# Patient Record
Sex: Female | Born: 1960 | Race: White | Hispanic: No | Smoking: Never smoker
Health system: Southern US, Community
[De-identification: ages and names within clinical notes are randomized; demographics above are authoritative.]

## PROBLEM LIST (undated history)

## (undated) DIAGNOSIS — T7840XA Allergy, unspecified, initial encounter: Secondary | ICD-10-CM

## (undated) DIAGNOSIS — F419 Anxiety disorder, unspecified: Secondary | ICD-10-CM

## (undated) DIAGNOSIS — K219 Gastro-esophageal reflux disease without esophagitis: Secondary | ICD-10-CM

## (undated) DIAGNOSIS — F32A Depression, unspecified: Secondary | ICD-10-CM

## (undated) HISTORY — DX: Depression, unspecified: F32.A

## (undated) HISTORY — DX: Anxiety disorder, unspecified: F41.9

## (undated) HISTORY — DX: Gastro-esophageal reflux disease without esophagitis: K21.9

## (undated) HISTORY — PX: BUNIONECTOMY: SHX129

## (undated) HISTORY — DX: Allergy, unspecified, initial encounter: T78.40XA

## (undated) HISTORY — PX: OTHER SURGICAL HISTORY: SHX169

---

## 2014-12-01 ENCOUNTER — Ambulatory Visit (INDEPENDENT_AMBULATORY_CARE_PROVIDER_SITE_OTHER): Payer: Self-pay | Admitting: Physician Assistant

## 2014-12-01 ENCOUNTER — Encounter: Payer: Self-pay | Admitting: Physician Assistant

## 2014-12-01 VITALS — BP 102/63 | HR 79 | Ht 68.0 in | Wt 143.0 lb

## 2014-12-01 DIAGNOSIS — G4700 Insomnia, unspecified: Secondary | ICD-10-CM | POA: Insufficient documentation

## 2014-12-01 DIAGNOSIS — F32A Depression, unspecified: Secondary | ICD-10-CM

## 2014-12-01 DIAGNOSIS — F329 Major depressive disorder, single episode, unspecified: Secondary | ICD-10-CM

## 2014-12-01 DIAGNOSIS — D259 Leiomyoma of uterus, unspecified: Secondary | ICD-10-CM

## 2014-12-01 DIAGNOSIS — F411 Generalized anxiety disorder: Secondary | ICD-10-CM

## 2014-12-01 DIAGNOSIS — B0050 Herpesviral ocular disease, unspecified: Secondary | ICD-10-CM

## 2014-12-01 DIAGNOSIS — F410 Panic disorder [episodic paroxysmal anxiety] without agoraphobia: Secondary | ICD-10-CM

## 2014-12-01 MED ORDER — CITALOPRAM HYDROBROMIDE 40 MG PO TABS: 40.0000 mg | ORAL_TABLET | Freq: Every day | ORAL | Status: DC

## 2014-12-01 MED ORDER — ZOLPIDEM TARTRATE 10 MG PO TABS: 10.0000 mg | ORAL_TABLET | Freq: Every evening | ORAL | Status: DC | PRN

## 2014-12-01 MED ORDER — ACYCLOVIR 400 MG PO TABS: 400.0000 mg | ORAL_TABLET | Freq: Two times a day (BID) | ORAL | Status: DC

## 2014-12-01 MED ORDER — LORAZEPAM 0.5 MG PO TABS: 0.5000 mg | ORAL_TABLET | Freq: Three times a day (TID) | ORAL | Status: DC | PRN

## 2014-12-01 NOTE — Progress Notes (Signed)
   Subjective:    Patient ID: Betty Stuart, female    DOB: Jan 15, 1961, 54 y.o.   MRN: [id number]  HPI Pt is a 54 yo female who presents to the clinic to establish care and get medication refills. She has no insurance for a while so only trying to do bare minimal.   .. Active Ambulatory Problems    Diagnosis Date Noted  . Panic attack 12/01/2014  . GAD (generalized anxiety disorder) 12/01/2014  . Depression 12/01/2014  . 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 Insomnia 12/01/2014  . Uterine fibroid 12/01/2014   Resolved Ambulatory Problems    Diagnosis Date Noted  . No Resolved Ambulatory Problems   No Additional Past Medical History   .Marland Kitchen Family History  Problem Relation Age of Onset  . Alcohol abuse Maternal Uncle   . Alcohol abuse Paternal Uncle    .Marland Kitchen History   Social History  . Marital Status: Married    Spouse Name: N/A  . Number of Children: N/A  . Years of Education: N/A   Occupational History  . Not on file.   Social History Main Topics  . Smoking status: Never Smoker   . Smokeless tobacco: Not on file  . Alcohol Use: 0.6 oz/week    1 Standard drinks or equivalent per week  . Drug Use: No  . Sexual Activity:    Partners: Male   Other Topics Concern  . Not on file   Social History Narrative  . No narrative on file      Review of Systems  All other systems reviewed and are negative.      Objective:   Physical Exam  Constitutional: She is oriented to person, place, and time. She appears well-developed and well-nourished.  HENT:  Head: Normocephalic and atraumatic.  Cardiovascular: Normal rate, regular rhythm and normal heart sounds.   Pulmonary/Chest: Effort normal and breath sounds normal.  Neurological: She is alert and oriented to person, place, and time.  Skin: Skin is dry.  Psychiatric: She has a normal mood and affect. Her behavior is normal.          Assessment & Plan:  GAD/Depression/panic attack- GAD-7 was 6. PHQ-9 was 6.  Controlled. Refilled ativan and celexa for 6 months.   Herpes of eye- refilled rx. Discussed any flares to see opthmaologist.   Insomnia- refilled ambien for 6 months.   Discussed needs CPE in future. No insurance at this time will hold off.  Last lipid per pt was Jan 2015 and good.

## 2015-06-01 ENCOUNTER — Encounter: Payer: Self-pay | Admitting: Physician Assistant

## 2015-06-01 ENCOUNTER — Ambulatory Visit (INDEPENDENT_AMBULATORY_CARE_PROVIDER_SITE_OTHER): Payer: Self-pay | Admitting: Physician Assistant

## 2015-06-01 VITALS — BP 106/69 | HR 75 | Ht 68.0 in | Wt 141.0 lb

## 2015-06-01 DIAGNOSIS — M5126 Other intervertebral disc displacement, lumbar region: Secondary | ICD-10-CM | POA: Insufficient documentation

## 2015-06-01 DIAGNOSIS — F32A Depression, unspecified: Secondary | ICD-10-CM

## 2015-06-01 DIAGNOSIS — F411 Generalized anxiety disorder: Secondary | ICD-10-CM

## 2015-06-01 DIAGNOSIS — G4700 Insomnia, unspecified: Secondary | ICD-10-CM

## 2015-06-01 DIAGNOSIS — F329 Major depressive disorder, single episode, unspecified: Secondary | ICD-10-CM

## 2015-06-01 DIAGNOSIS — F410 Panic disorder [episodic paroxysmal anxiety] without agoraphobia: Secondary | ICD-10-CM

## 2015-06-01 LAB — COMPLETE METABOLIC PANEL WITH GFR
ALT: 16 U/L (ref 6–29)
AST: 18 U/L (ref 10–35)
Albumin: 4.3 g/dL (ref 3.6–5.1)
Alkaline Phosphatase: 46 U/L (ref 33–130)
BILIRUBIN TOTAL: 0.3 mg/dL (ref 0.2–1.2)
BUN: 15 mg/dL (ref 7–25)
CHLORIDE: 104 mmol/L (ref 98–110)
CO2: 28 mmol/L (ref 20–31)
CREATININE: 0.85 mg/dL (ref 0.50–1.05)
Calcium: 9.3 mg/dL (ref 8.6–10.4)
GFR, Est Non African American: 79 mL/min (ref >=60)
GLUCOSE: 89 mg/dL (ref 65–99)
Potassium: 4.2 mmol/L (ref 3.5–5.3)
Sodium: 143 mmol/L (ref 135–146)
TOTAL PROTEIN: 6.3 g/dL (ref 6.1–8.1)

## 2015-06-01 MED ORDER — LORAZEPAM 0.5 MG PO TABS: 0.5000 mg | ORAL_TABLET | Freq: Three times a day (TID) | ORAL | Status: DC | PRN

## 2015-06-01 MED ORDER — ZOLPIDEM TARTRATE 10 MG PO TABS: 10.0000 mg | ORAL_TABLET | Freq: Every evening | ORAL | Status: DC | PRN

## 2015-06-01 MED ORDER — CITALOPRAM HYDROBROMIDE 40 MG PO TABS: 40.0000 mg | ORAL_TABLET | Freq: Every day | ORAL | Status: DC

## 2015-06-01 NOTE — Progress Notes (Signed)
   Subjective:    Patient ID: Betty Stuart, female    DOB: Jan 02, 1961, 54 y.o.   MRN: [id number]  HPI Pt presents to the clinic for medication refill.   Anxiety/depression/panic attacks- she feels fairly controlled today. Her dad is dying so her stress level has worsened. Taking celexa and ativan as needed. No side effects. No suicidal or homicidal thoughts.   Insomnia- well controlled with ambien as needed.   Does not have insurance.    Review of Systems  All other systems reviewed and are negative.      Objective:   Physical Exam  Constitutional: She is oriented to person, place, and time. She appears well-developed and well-nourished.  HENT:  Head: Normocephalic and atraumatic.  Neck: Normal range of motion. Neck supple. No thyromegaly present.  Cardiovascular: Normal rate, regular rhythm and normal heart sounds.   Pulmonary/Chest: Effort normal and breath sounds normal.  Neurological: She is alert and oriented to person, place, and time.  Skin: Skin is dry.  Psychiatric: She has a normal mood and affect. Her behavior is normal.          Assessment & Plan:  GAD/depression/panic attack- PHQ-9 was 6. GAD-7 was 5. Pt is going through a lot with Dad but overall feels controlled. Refilled celexa and ativan prn. cmp ordered for med management  Insomnia- refilled ambien for 6 months.   No insurance declined health maintanence due to cost.  Look for free mammogram in October for breast cancer awareness. Will do things as money presents.  Pt is aware of this risk.

## 2015-09-19 ENCOUNTER — Encounter: Payer: Self-pay | Admitting: Physician Assistant

## 2015-09-19 ENCOUNTER — Ambulatory Visit (INDEPENDENT_AMBULATORY_CARE_PROVIDER_SITE_OTHER): Payer: Self-pay | Admitting: Physician Assistant

## 2015-09-19 VITALS — BP 118/86 | HR 75 | Temp 98.4°F | Ht 68.0 in | Wt 141.0 lb

## 2015-09-19 DIAGNOSIS — J069 Acute upper respiratory infection, unspecified: Secondary | ICD-10-CM

## 2015-09-19 MED ORDER — HYDROCODONE-HOMATROPINE 5-1.5 MG/5ML PO SYRP: 5.0000 mL | ORAL_SOLUTION | Freq: Every evening | ORAL | Status: DC | PRN

## 2015-09-19 MED ORDER — AMOXICILLIN-POT CLAVULANATE 875-125 MG PO TABS: 1.0000 | ORAL_TABLET | Freq: Two times a day (BID) | ORAL | Status: DC

## 2015-09-19 NOTE — Patient Instructions (Signed)
Upper Respiratory Infection, Adult Most upper respiratory infections (URIs) are a viral infection of the air passages leading to the lungs. A URI affects the nose, throat, and upper air passages. The most common type of URI is nasopharyngitis and is typically referred to as "the common cold." URIs run their course and usually go away on their own. Most of the time, a URI does not require medical attention, but sometimes a bacterial infection in the upper airways can follow a viral infection. This is called a secondary infection. Sinus and middle ear infections are common types of secondary upper respiratory infections. Bacterial pneumonia can also complicate a URI. A URI can worsen asthma and chronic obstructive pulmonary disease (COPD). Sometimes, these complications can require emergency medical care and may be life threatening.  CAUSES Almost all URIs are caused by viruses. A virus is a type of germ and can spread from one person to another.  RISKS FACTORS You may be at risk for a URI if:   You smoke.   You have chronic heart or lung disease.  You have a weakened defense (immune) system.   You are very young or very old.   You have nasal allergies or asthma.  You work in crowded or poorly ventilated areas.  You work in health care facilities or schools. SIGNS AND SYMPTOMS  Symptoms typically develop 2-3 days after you come in contact with a cold virus. Most viral URIs last 7-10 days. However, viral URIs from the influenza virus (flu virus) can last 14-18 days and are typically more severe. Symptoms may include:   Runny or stuffy (congested) nose.   Sneezing.   Cough.   Sore throat.   Headache.   Fatigue.   Fever.   Loss of appetite.   Pain in your forehead, behind your eyes, and over your cheekbones (sinus pain).  Muscle aches.  DIAGNOSIS  Your health care provider may diagnose a URI by:  Physical exam.  Tests to check that your symptoms are not due to  another condition such as:  Strep throat.  Sinusitis.  Pneumonia.  Asthma. TREATMENT  A URI goes away on its own with time. It cannot be cured with medicines, but medicines may be prescribed or recommended to relieve symptoms. Medicines may help:  Reduce your fever.  Reduce your cough.  Relieve nasal congestion. HOME CARE INSTRUCTIONS   Take medicines only as directed by your health care provider.   Gargle warm saltwater or take cough drops to comfort your throat as directed by your health care provider.  Use a warm mist humidifier or inhale steam from a shower to increase air moisture. This may make it easier to breathe.  Drink enough fluid to keep your urine clear or pale yellow.   Eat soups and other clear broths and maintain good nutrition.   Rest as needed.   Return to work when your temperature has returned to normal or as your health care provider advises. You may need to stay home longer to avoid infecting others. You can also use a face mask and careful hand washing to prevent spread of the virus.  Increase the usage of your inhaler if you have asthma.   Do not use any tobacco products, including cigarettes, chewing tobacco, or electronic cigarettes. If you need help quitting, ask your health care provider. PREVENTION  The best way to protect yourself from getting a cold is to practice good hygiene.   Avoid oral or hand contact with people with cold   symptoms.   Wash your hands often if contact occurs.  There is no clear evidence that vitamin C, vitamin E, echinacea, or exercise reduces the chance of developing a cold. However, it is always recommended to get plenty of rest, exercise, and practice good nutrition.  SEEK MEDICAL CARE IF:   You are getting worse rather than better.   Your symptoms are not controlled by medicine.   You have chills.  You have worsening shortness of breath.  You have brown or red mucus.  You have yellow or brown nasal  discharge.  You have pain in your face, especially when you bend forward.  You have a fever.  You have swollen neck glands.  You have pain while swallowing.  You have white areas in the back of your throat. SEEK IMMEDIATE MEDICAL CARE IF:   You have severe or persistent:  Headache.  Ear pain.  Sinus pain.  Chest pain.  You have chronic lung disease and any of the following:  Wheezing.  Prolonged cough.  Coughing up blood.  A change in your usual mucus.  You have a stiff neck.  You have changes in your:  Vision.  Hearing.  Thinking.  Mood. MAKE SURE YOU:   Understand these instructions.  Will watch your condition.  Will get help right away if you are not doing well or get worse.   This information is not intended to replace advice given to you by your health care provider. Make sure you discuss any questions you have with your health care provider.   Document Released: 03/13/2001 Document Revised: 02/01/2015 Document Reviewed: 12/23/2013 Elsevier Interactive Patient Education 2016 [employer]  

## 2015-09-19 NOTE — Progress Notes (Signed)
   Subjective:    Patient ID: Markus Daft, female    DOB: 20-Dec-1960, 54 y.o.   MRN: [id number]  HPI   patient is a 54 year old female who presents to the clinic with sore throat, nasal congestion, sinus pressure  For 5 days. She has had a very mild cough with no production. She denies any fever, chills, body ache. She is taking Sudafed, Delsym, Tylenol, ibuprofen with little to no relief. She has not been able to work for the last 3 days.     Review of Systems  All other systems reviewed and are negative.      Objective:   Physical Exam  Constitutional: She is oriented to person, place, and time. She appears well-developed and well-nourished.  HENT:  Head: Normocephalic and atraumatic.  TM's amber color, dull light reflex, erythematous.  Tenderness over maxillary sinuses to palpation.  Bilateral nares red and swollen.  Oropharynx erythematous with PND>   Eyes: Conjunctivae are normal. Right eye exhibits no discharge. Left eye exhibits no discharge.  Neck: Normal range of motion. Neck supple.  Bilateral anterior cervical adenopathy non tender.   Cardiovascular: Normal rate, regular rhythm and normal heart sounds.   Pulmonary/Chest: Effort normal and breath sounds normal. She has no wheezes.  Neurological: She is alert and oriented to person, place, and time.  Skin: Skin is dry.  Psychiatric: She has a normal mood and affect. Her behavior is normal.          Assessment & Plan:   acute upper respiratory tract infection - certainly feel like this could be the start of bacterial infection from viral infection. Sinusitis seems to be developing. Treated with augmentin for 10 days. Hycodan given for cough. Pt has nausea associated with hydrocodone. Pt aware but would like to try if at half the dose. Encouraged pt to also use mucinex and flonase. Follow up as needed.

## 2015-11-29 ENCOUNTER — Encounter: Payer: Self-pay | Admitting: Physician Assistant

## 2015-11-29 ENCOUNTER — Ambulatory Visit (INDEPENDENT_AMBULATORY_CARE_PROVIDER_SITE_OTHER): Payer: Self-pay | Admitting: Physician Assistant

## 2015-11-29 VITALS — BP 106/60 | HR 80 | Ht 68.0 in | Wt 138.0 lb

## 2015-11-29 DIAGNOSIS — B0050 Herpesviral ocular disease, unspecified: Secondary | ICD-10-CM

## 2015-11-29 DIAGNOSIS — Z87410 Personal history of cervical dysplasia: Secondary | ICD-10-CM

## 2015-11-29 DIAGNOSIS — F329 Major depressive disorder, single episode, unspecified: Secondary | ICD-10-CM

## 2015-11-29 DIAGNOSIS — F411 Generalized anxiety disorder: Secondary | ICD-10-CM

## 2015-11-29 DIAGNOSIS — F32A Depression, unspecified: Secondary | ICD-10-CM

## 2015-11-29 DIAGNOSIS — G4700 Insomnia, unspecified: Secondary | ICD-10-CM

## 2015-11-29 MED ORDER — ACYCLOVIR 400 MG PO TABS: 400.0000 mg | ORAL_TABLET | Freq: Two times a day (BID) | ORAL | Status: DC

## 2015-11-29 MED ORDER — CITALOPRAM HYDROBROMIDE 40 MG PO TABS: 40.0000 mg | ORAL_TABLET | Freq: Every day | ORAL | Status: DC

## 2015-11-29 MED ORDER — ESZOPICLONE 1 MG PO TABS: ORAL_TABLET | ORAL | Status: DC

## 2015-11-29 NOTE — Progress Notes (Signed)
   Subjective:    Patient ID: Betty Stuart, female    DOB: 12-28-1960, 55 y.o.   MRN: [id number]  HPI  Pt is a 55 yo female who presents to the clinic for 6 month follow up.   She does want to switch ambien. She has been on for 15 plus years. She has always had sleep trouble per pt 90 percent of her life. She just feels like Lorrin Mais is not working as well anymore. She has never tried anything else.   GAD/Depression- doing well. No concerns of problems. Taking medication daily.  Herpes of eye- taking acyclovir daily for suppression. No problems.   She knows she needs pap. Hx of dysplasia and biopsy in 2014.    Review of Systems  All other systems reviewed and are negative.      Objective:   Physical Exam  Constitutional: She is oriented to person, place, and time. She appears well-developed and well-nourished.  HENT:  Head: Normocephalic and atraumatic.  Cardiovascular: Normal rate, regular rhythm and normal heart sounds.   Pulmonary/Chest: Effort normal and breath sounds normal. She has no wheezes.  Neurological: She is alert and oriented to person, place, and time.  Skin: Skin is dry.  Psychiatric: She has a normal mood and affect. Her behavior is normal.          Assessment & Plan:  Insomnia- stopped ambien. Start lunesta 1-3 tablets at bedtime. Discussed side effects. Call and let me know how doing. Will not make follow up due to being self pay.   Herpes of eye- refilled suppression therapy with acyclovir daily.   Depression/GAD- PHQ-9 was 8. GAD-7 was 6. Pt feels controlled. Refilled celexa for 6 months. She does not need ativan refill only used as needed.   History of cervical dysplasia- needs pap. Pt aware. She stated she will wait until 6 month follow up.   Discussed health prevention she has no insurance and cannot afford. Discussed looking for free screenings.

## 2016-01-10 ENCOUNTER — Telehealth: Payer: Self-pay | Admitting: *Deleted

## 2016-01-10 ENCOUNTER — Other Ambulatory Visit: Payer: Self-pay | Admitting: *Deleted

## 2016-01-10 MED ORDER — ZOLPIDEM TARTRATE 10 MG PO TABS: 10.0000 mg | ORAL_TABLET | Freq: Every evening | ORAL | Status: DC | PRN

## 2016-01-10 NOTE — Telephone Encounter (Signed)
Yes ok for Betty Stuart you can send over.

## 2016-01-10 NOTE — Telephone Encounter (Signed)
Pt left vm stating that the lunesta you switched her to doesn't seem to work as well and wants to know if she can go back to the 10mg  zolpidem. Please advise.

## 2016-01-11 NOTE — Telephone Encounter (Signed)
Pt notified of rx. 

## 2016-03-27 ENCOUNTER — Encounter: Payer: Self-pay | Admitting: Physician Assistant

## 2016-03-27 ENCOUNTER — Ambulatory Visit (INDEPENDENT_AMBULATORY_CARE_PROVIDER_SITE_OTHER): Payer: Self-pay | Admitting: Physician Assistant

## 2016-03-27 VITALS — BP 124/65 | HR 76 | Ht 68.0 in | Wt 141.0 lb

## 2016-03-27 DIAGNOSIS — F410 Panic disorder [episodic paroxysmal anxiety] without agoraphobia: Secondary | ICD-10-CM

## 2016-03-27 DIAGNOSIS — F329 Major depressive disorder, single episode, unspecified: Secondary | ICD-10-CM

## 2016-03-27 DIAGNOSIS — F411 Generalized anxiety disorder: Secondary | ICD-10-CM

## 2016-03-27 DIAGNOSIS — F32A Depression, unspecified: Secondary | ICD-10-CM

## 2016-03-27 DIAGNOSIS — G4700 Insomnia, unspecified: Secondary | ICD-10-CM

## 2016-03-27 MED ORDER — ZOLPIDEM TARTRATE 10 MG PO TABS: 10.0000 mg | ORAL_TABLET | Freq: Every evening | ORAL | Status: DC | PRN

## 2016-03-27 MED ORDER — ACYCLOVIR 400 MG PO TABS: 400.0000 mg | ORAL_TABLET | Freq: Two times a day (BID) | ORAL | Status: DC

## 2016-03-27 MED ORDER — CITALOPRAM HYDROBROMIDE 40 MG PO TABS: 40.0000 mg | ORAL_TABLET | Freq: Every day | ORAL | Status: DC

## 2016-03-27 MED ORDER — LORAZEPAM 0.5 MG PO TABS: 0.5000 mg | ORAL_TABLET | Freq: Three times a day (TID) | ORAL | Status: DC | PRN

## 2016-03-27 NOTE — Progress Notes (Signed)
   Subjective:    Patient ID: Betty Stuart, female    DOB: 1961-09-08, 55 y.o.   MRN: [id number]  HPI Pt is a 55 yo female who presents to the clinic for medication refill.   Insomnia- controlled with ambien. No problems or concerns.   Herpes simplex of eye- on suppression therapy with antiviral.   GAD/panic attacks- she is quiting her job on Friday which should help with anxiety and panic attacks. She is taking ativan twice a day. She takes celexa daily. No suicidal or homicidal thoughts.   Pt will be out of insurance and a job on Friday. Needs 6 month refills.   Review of Systems  All other systems reviewed and are negative.      Objective:   Physical Exam  Constitutional: She is oriented to person, place, and time. She appears well-developed and well-nourished.  HENT:  Head: Normocephalic and atraumatic.  Cardiovascular: Normal rate, regular rhythm and normal heart sounds.   Pulmonary/Chest: Effort normal and breath sounds normal.  Neurological: She is alert and oriented to person, place, and time.  Skin: Skin is dry.  Psychiatric: She has a normal mood and affect. Her behavior is normal.          Assessment & Plan:  Insomnia- ambien refilled for 6 months.   GAD/panic attacks/depression- PHQ-9 was 4. GAD-7 was 4. Refill celexa and ativan. For 6 months.   Herpes simplex of eyes- acyclovir refilled for 6 months.   Discussion of need for CPE, colonoscopy, pap smear, mammogram. Pt states once she gets a job and insurance again she will schedule.

## 2016-05-28 ENCOUNTER — Ambulatory Visit: Payer: Self-pay | Admitting: Physician Assistant

## 2016-10-09 ENCOUNTER — Ambulatory Visit (INDEPENDENT_AMBULATORY_CARE_PROVIDER_SITE_OTHER): Payer: Self-pay | Admitting: Physician Assistant

## 2016-10-09 ENCOUNTER — Encounter: Payer: Self-pay | Admitting: Physician Assistant

## 2016-10-09 VITALS — BP 114/66 | HR 84 | Ht 68.0 in | Wt 142.0 lb

## 2016-10-09 DIAGNOSIS — F411 Generalized anxiety disorder: Secondary | ICD-10-CM

## 2016-10-09 DIAGNOSIS — B001 Herpesviral vesicular dermatitis: Secondary | ICD-10-CM | POA: Insufficient documentation

## 2016-10-09 DIAGNOSIS — F5104 Psychophysiologic insomnia: Secondary | ICD-10-CM

## 2016-10-09 DIAGNOSIS — F3340 Major depressive disorder, recurrent, in remission, unspecified: Secondary | ICD-10-CM

## 2016-10-09 MED ORDER — ESZOPICLONE 3 MG PO TABS: 3.0000 mg | ORAL_TABLET | Freq: Every day | ORAL | 5 refills | Status: DC

## 2016-10-09 MED ORDER — LORAZEPAM 0.5 MG PO TABS: 0.5000 mg | ORAL_TABLET | Freq: Three times a day (TID) | ORAL | 1 refills | Status: DC | PRN

## 2016-10-09 MED ORDER — CITALOPRAM HYDROBROMIDE 40 MG PO TABS: 40.0000 mg | ORAL_TABLET | Freq: Every day | ORAL | 1 refills | Status: DC

## 2016-10-09 MED ORDER — ACYCLOVIR 400 MG PO TABS: 400.0000 mg | ORAL_TABLET | Freq: Two times a day (BID) | ORAL | 1 refills | Status: DC

## 2016-10-09 NOTE — Progress Notes (Signed)
   Subjective:    Patient ID: Betty Stuart, female    DOB: 1961/08/22, 56 y.o.   MRN: [id number]  HPI  Pt is a 56 yo female who presents to the clinic for medication refill.   Depression/GAD- doing great. Much improved. [employer] job. Very happy. Feels like she has more energy. Denies any suicidal or homicidal thoughts.   Insomnia- taking ambien and ativan together to go to sleep. Remembers lunesta worked better but could not afford. She now has an appt that makes lunesta affordable.   No insurance declined health maintaince.   Pt had screening labs done at work and brings in copy to scan into system.   Review of Systems  All other systems reviewed and are negative.      Objective:   Physical Exam  Constitutional: She is oriented to person, place, and time. She appears well-developed and well-nourished.  HENT:  Head: Normocephalic and atraumatic.  Cardiovascular: Normal rate, regular rhythm and normal heart sounds.   Pulmonary/Chest: Effort normal and breath sounds normal.  Neurological: She is alert and oriented to person, place, and time.  Psychiatric: She has a normal mood and affect. Her behavior is normal.          Assessment & Plan:  Marland KitchenMarland KitchenDiagnoses and all orders for this visit:  Recurrent major depressive disorder, in remission ([place]) -     citalopram (CELEXA) 40 MG tablet; Take 1 tablet (40 mg total) by mouth daily.  GAD (generalized anxiety disorder) -     citalopram (CELEXA) 40 MG tablet; Take 1 tablet (40 mg total) by mouth daily. -     LORazepam (ATIVAN) 0.5 MG tablet; Take 1 tablet (0.5 mg total) by mouth every 8 (eight) hours as needed for anxiety.  Psychophysiological insomnia -     Eszopiclone 3 MG TABS; Take 1 tablet (3 mg total) by mouth at bedtime. Take immediately before bedtime  Recurrent cold sores -     acyclovir (ZOVIRAX) 400 MG tablet; Take 1 tablet (400 mg total) by mouth 2 (two) times daily.    PHQ-9 was 1. GAD-7 was 1.   Pt is doing great.    LDL is 99, TG is 57, glucose 86, WML complete CMP.   Pt will schedule cPE for march when she gets insurance and talk about other health prevention needs.

## 2016-12-07 ENCOUNTER — Encounter: Payer: Self-pay | Admitting: Physician Assistant

## 2016-12-07 ENCOUNTER — Other Ambulatory Visit (HOSPITAL_COMMUNITY)
Admission: RE | Admit: 2016-12-07 | Discharge: 2016-12-07 | Disposition: A | Discharge status: Home / Self Care | Payer: 59 | Source: Ambulatory Visit | Attending: Physician Assistant | Admitting: Physician Assistant

## 2016-12-07 ENCOUNTER — Ambulatory Visit (INDEPENDENT_AMBULATORY_CARE_PROVIDER_SITE_OTHER): Payer: 59 | Admitting: Physician Assistant

## 2016-12-07 VITALS — BP 99/60 | HR 80 | Ht 68.0 in | Wt 141.0 lb

## 2016-12-07 DIAGNOSIS — Z1151 Encounter for screening for human papillomavirus (HPV): Secondary | ICD-10-CM | POA: Diagnosis not present

## 2016-12-07 DIAGNOSIS — Z01419 Encounter for gynecological examination (general) (routine) without abnormal findings: Secondary | ICD-10-CM | POA: Insufficient documentation

## 2016-12-07 DIAGNOSIS — B351 Tinea unguium: Secondary | ICD-10-CM | POA: Diagnosis not present

## 2016-12-07 MED ORDER — TERBINAFINE HCL 250 MG PO TABS: 250.0000 mg | ORAL_TABLET | Freq: Every day | ORAL | 0 refills | Status: AC

## 2016-12-07 NOTE — Progress Notes (Signed)
Subjective:    Patient ID: Betty Stuart, female    DOB: 08-Jan-1961, 56 y.o.   MRN: [id number]  HPI Pt is a 56 yo female who presents to the clinic for pap smear. She has never had abnormal pap. She is currently not sexually active. She has no concerns or complaints.   .. Active Ambulatory Problems    Diagnosis Date Noted  . Panic attack 12/01/2014  . GAD (generalized anxiety disorder) 12/01/2014  . Depression 12/01/2014  . 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 Insomnia 12/01/2014  . Uterine fibroid 12/01/2014  . Lumbar herniated disc 06/01/2015  . History of cervical dysplasia 11/29/2015  . 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   Resolved Ambulatory Problems    Diagnosis Date Noted  . No Resolved Ambulatory Problems   No Additional Past Medical History   .Marland Kitchen Family History  Problem Relation Age of Onset  . Alcohol abuse Maternal Uncle   . Alcohol abuse Paternal Uncle    .Marland Kitchen Social History   Social History  . Marital status: Married    Spouse name: N/A  . Number of children: N/A  . Years of education: N/A   Occupational History  . Not on file.   Social History Main Topics  . Smoking status: Never Smoker  . Smokeless tobacco: Never Used  . Alcohol use 0.6 oz/week    1 Standard drinks or equivalent per week  . Drug use: No  . Sexual activity: Not Currently    Partners: Male   Other Topics Concern  . Not on file   Social History Narrative  . No narrative on file      Review of Systems  All other systems reviewed and are negative.      Objective:   Physical Exam  Constitutional: She is oriented to person, place, and time. She appears well-developed and well-nourished.  HENT:  Head: Normocephalic and atraumatic.  Right Ear: External ear normal.  Left Ear: External ear normal.  Nose: Nose normal.  Mouth/Throat: Oropharynx is clear and moist. No oropharyngeal exudate.  Eyes: Conjunctivae and EOM are normal. Pupils are equal, round, and reactive to light.  Right eye exhibits no discharge. Left eye exhibits no discharge.  Neck: Normal range of motion. Neck supple.  Cardiovascular: Normal rate, regular rhythm and normal heart sounds.   Pulmonary/Chest: Effort normal and breath sounds normal. She has no wheezes.  No breast masses or tenderness.   Abdominal: Soft. Bowel sounds are normal. She exhibits no distension and no mass. There is no tenderness. There is no rebound and no guarding.  Genitourinary: Vagina normal and uterus normal. No vaginal discharge found.  Genitourinary Comments: No adenxal masses or tenderness.  No cervical polyps or erythema.   Lymphadenopathy:    She has no cervical adenopathy.  Neurological: She is alert and oriented to person, place, and time.  Skin:  All toenails thick and yellow.   Psychiatric: She has a normal mood and affect. Her behavior is normal.          Assessment & Plan:  Marland KitchenMarland KitchenDawn was seen today for annual exam and gynecologic exam.  Diagnoses and all orders for this visit:  Encounter for routine gynecological examination with Papanicolaou smear of cervix -     Cytology - PAP  Onychomycosis -     terbinafine (LAMISIL) 250 MG tablet; Take 1 tablet (250 mg total) by mouth daily. Take for 12 weeks.   Pt declined STD testing.  Will get mammogram in summer per  patient. Pt declines colonoscopy until later.  Start lamisil for fungus. Recheck liver enzymes in 6 weeks.

## 2016-12-07 NOTE — Patient Instructions (Addendum)
Keeping You Healthy  Get These Tests  Blood Pressure- Have your blood pressure checked by your healthcare provider at least once a year.  Normal blood pressure is 120/80.  Weight- Have your body mass index (BMI) calculated to screen for obesity.  BMI is a measure of body fat based on height and weight.  You can calculate your own BMI at www.nhlbisupport.com/bmi/  Cholesterol- Have your cholesterol checked every year.  Diabetes- Have your blood sugar checked every year if you have high blood pressure, high cholesterol, a family history of diabetes or if you are overweight.  Pap Test - Have a pap test every 1 to 5 years if you have been sexually active.  If you are older than 54 and recent pap tests have been normal you may not need additional pap tests.  In addition, if you have had a hysterectomy  for benign disease additional pap tests are not necessary.  Mammogram-Yearly mammograms are essential for early detection of breast cancer  Screening for Colon Cancer- Colonoscopy starting at age 50. Screening may begin sooner depending on your family history and other health conditions.  Follow up colonoscopy as directed by your Gastroenterologist.  Screening for Osteoporosis- Screening begins at age 54 with bone density scanning, sooner if you are at higher risk for developing Osteoporosis.  Get these medicines  Calcium with Vitamin D- Your body requires 1200-1500 mg of Calcium a day and 800-1000 IU of Vitamin D a day.  You can only absorb 500 mg of Calcium at a time therefore Calcium must be taken in 2 or 3 separate doses throughout the day.  Hormones- Hormone therapy has been associated with increased risk for certain cancers and heart disease.  Talk to your healthcare provider about if you need relief from menopausal symptoms.  Aspirin- Ask your healthcare provider about taking Aspirin to prevent Heart Disease and Stroke.  Get these Immuniztions  Flu shot- Every fall  Pneumonia shot-  Once after the age of 54; if you are younger ask your healthcare provider if you need a pneumonia shot.  Tetanus- Every ten years.  Zostavax- Once after the age of 54 to prevent shingles.  Take these steps  Don't smoke- Your healthcare provider can help you quit. For tips on how to quit, ask your healthcare provider or go to www.smokefree.gov or call 1-800 QUIT-NOW.  Be physically active- Exercise 5 days a week for a minimum of 30 minutes.  If you are not already physically active, start slow and gradually work up to 30 minutes of moderate physical activity.  Try walking, dancing, bike riding, swimming, etc.  Eat a healthy diet- Eat a variety of healthy foods such as fruits, vegetables, whole grains, low fat milk, low fat cheeses, yogurt, lean meats, chicken, fish, eggs, dried beans, tofu, etc.  For more information go to www.thenutritionsource.org  Dental visit- Brush and floss teeth twice daily; visit your dentist twice a year.  Eye exam- Visit your Optometrist or Ophthalmologist yearly.  Drink alcohol in moderation- Limit alcohol intake to one drink or less a day.  Never drink and drive.  Depression- Your emotional health is as important as your physical health.  If you're feeling down or losing interest in things you normally enjoy, please talk to your healthcare provider.  Seat Belts- can save your life; always wear one  Smoke/Carbon Monoxide detectors- These detectors need to be installed on the appropriate level of your home.  Replace batteries at least once a year.  Violence- If   anyone is threatening or hurting you, please tell your healthcare provider.  Living Will/ Health care power of attorney- Discuss with your healthcare provider and family.  Preventing Toenail Fungus from Recurring   Sanitize your shoes with Mycomist spray or a similar shoe sanitizer spray.  Follow the instructions on the bottle and dry them outside in the sun or with a hairdryer.  We also recommend  repeating the sanitization once weekly in shoes you wear most often.   Throw away any shoes you have worn a significant amount without socks-fungus thrives in a warm moist environment and you want to avoid re-infection after your laser procedure   Bleach your socks with regular or color safe bleach   Change your socks regularly to keep your feet clean and dry (especially if you have sweaty feet)-if sweaty feet are a problem, let your doctor know-there is a great lotion that helps with this problem.   Clean your toenail clippers with alcohol before you use them if you do your own toenails and make sure to replace [place] boards and orange sticks regularly   If you get regular pedicures, bring your own instruments or go to a spa that sterilizes their instruments in an autoclave.

## 2016-12-13 LAB — CYTOLOGY - PAP
DIAGNOSIS: NEGATIVE
HPV (WINDOPATH): NOT DETECTED

## 2016-12-18 ENCOUNTER — Encounter: Payer: Self-pay | Admitting: Physician Assistant

## 2017-03-13 ENCOUNTER — Encounter: Payer: Self-pay | Admitting: Physician Assistant

## 2017-03-13 ENCOUNTER — Ambulatory Visit (INDEPENDENT_AMBULATORY_CARE_PROVIDER_SITE_OTHER): Payer: 59 | Admitting: Physician Assistant

## 2017-03-13 VITALS — BP 103/64 | HR 87 | Ht 68.0 in | Wt 144.0 lb

## 2017-03-13 DIAGNOSIS — F411 Generalized anxiety disorder: Secondary | ICD-10-CM | POA: Diagnosis not present

## 2017-03-13 DIAGNOSIS — F5101 Primary insomnia: Secondary | ICD-10-CM

## 2017-03-13 DIAGNOSIS — F3340 Major depressive disorder, recurrent, in remission, unspecified: Secondary | ICD-10-CM | POA: Diagnosis not present

## 2017-03-13 DIAGNOSIS — B001 Herpesviral vesicular dermatitis: Secondary | ICD-10-CM

## 2017-03-13 DIAGNOSIS — B351 Tinea unguium: Secondary | ICD-10-CM

## 2017-03-13 DIAGNOSIS — Z1231 Encounter for screening mammogram for malignant neoplasm of breast: Secondary | ICD-10-CM

## 2017-03-13 DIAGNOSIS — B0050 Herpesviral ocular disease, unspecified: Secondary | ICD-10-CM

## 2017-03-13 MED ORDER — ACYCLOVIR 400 MG PO TABS: 400.0000 mg | ORAL_TABLET | Freq: Two times a day (BID) | ORAL | 3 refills | Status: DC

## 2017-03-13 MED ORDER — CITALOPRAM HYDROBROMIDE 40 MG PO TABS: 40.0000 mg | ORAL_TABLET | Freq: Every day | ORAL | 3 refills | Status: DC

## 2017-03-13 MED ORDER — CICLOPIROX 8 % EX SOLN: Freq: Every day | CUTANEOUS | 0 refills | Status: DC

## 2017-03-13 MED ORDER — ESZOPICLONE 3 MG PO TABS: 3.0000 mg | ORAL_TABLET | Freq: Every day | ORAL | 5 refills | Status: DC

## 2017-03-13 NOTE — Progress Notes (Signed)
Subjective:    Patient ID: Betty Stuart, female    DOB: 09-13-1961, 56 y.o.   MRN: [id number]  HPI Pt is a 56 yo female who presents to the clinic for 6 month follow up.   She is doing great with mood and anxiety. She rarely uses ativan and does not need refill. celexa is doing great. No side effects.   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 Doing great.   Insomnia- doing well with lunesta and melatonin. No concerns.   Toenail fungus- just completed lamisil for 3 months. Nails appear better. Wants to know if there is anything else she can do.    .. Active Ambulatory Problems    Diagnosis Date Noted  . Panic attack 12/01/2014  . GAD (generalized anxiety disorder) 12/01/2014  . Depression 12/01/2014  . 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 Insomnia 12/01/2014  . Uterine fibroid 12/01/2014  . Lumbar herniated disc 06/01/2015  . History of cervical dysplasia 11/29/2015  . 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 Toenail fungus 03/13/2017   Resolved Ambulatory Problems    Diagnosis Date Noted  . No Resolved Ambulatory Problems   No Additional Past Medical History      Review of Systems  All other systems reviewed and are negative.      Objective:   Physical Exam  Constitutional: She is oriented to person, place, and time. She appears well-developed and well-nourished.  HENT:  Head: Normocephalic and atraumatic.  Cardiovascular: Normal rate, regular rhythm and normal heart sounds.   Pulmonary/Chest: Effort normal and breath sounds normal.  Neurological: She is alert and oriented to person, place, and time.  Skin:  Great toenail bilateral discoloration and thickness with healthy nail growing from bed.   Psychiatric: She has a normal mood and affect. Her behavior is normal.          Assessment & Plan:   Marland KitchenMarland KitchenDiagnoses and all orders for this visit:  Recurrent major depressive disorder, in remission ([place]) -     citalopram (CELEXA)  40 MG tablet; Take 1 tablet (40 mg total) by mouth daily.  GAD (generalized anxiety disorder) -     citalopram (CELEXA) 40 MG tablet; Take 1 tablet (40 mg total) by mouth daily.  Primary insomnia -     Eszopiclone 3 MG TABS; Take 1 tablet (3 mg total) by mouth at bedtime. Take immediately before bedtime  Recurrent cold sores -     acyclovir (ZOVIRAX) 400 MG tablet; Take 1 tablet (400 mg total) by mouth 2 (two) times daily.  Herpes simplex of eye  Visit for screening mammogram -     MM SCREENING BREAST TOMO BILATERAL; Future -     MM SCREENING BREAST TOMO BILATERAL  Toenail fungus -     ciclopirox (PENLAC) 8 % solution; Apply topically at bedtime. Apply over nail and surrounding skin. Apply daily over previous coat. After seven (7) days, may remove with alcohol and continue cycle.  .. Depression screen [facility] 2/9 03/13/2017 03/13/2017 12/07/2016 10/09/2016 03/27/2016  Decreased Interest 0 0 0 0 1  Down, Depressed, Hopeless 1 0 1 0 1  PHQ - 2 Score 1 0 1 0 2  Altered sleeping 0 - - 1 0  Tired, decreased energy 0 - - 0 0  Change in appetite 0 - - 0 0  Feeling bad or failure about yourself  0 - - 0 0  Trouble concentrating - - - 0 0  Moving slowly or fidgety/restless  0 - - 0 0  Suicidal thoughts 0 - - 0 0  PHQ-9 Score 1 - - 1 2  Difficult doing work/chores - - - - Not difficult at all    .Marland Kitchen GAD 7 : Generalized Anxiety Score 03/13/2017 10/09/2016 03/27/2016 11/29/2015  Nervous, Anxious, on Edge 0 0 1 1  Control/stop worrying 0 0 1 2  Worry too much - different things 1 1 1 1   Trouble relaxing 0 0 0 1  Restless 0 0 0 -  Easily annoyed or irritable 1 0 1 1  Afraid - awful might happen 0 0 0 -  Total GAD 7 Score 2 1 4  -  Anxiety Difficulty Not difficult at all Not difficult at all - Not difficult at all    celexa refilled.   She just finished lamisil. Reassured patient nails appear to be growing good out of the nail bed. penlac given to continue treatment.   Info given on shingrix. Pt  will consider.  Declined colonoscopy.  Ordered mammogram.  Declined Tdap.

## 2017-03-27 ENCOUNTER — Ambulatory Visit: Payer: 59 | Admitting: Physician Assistant

## 2017-03-29 ENCOUNTER — Ambulatory Visit: Payer: 59 | Admitting: Physician Assistant

## 2017-05-07 ENCOUNTER — Ambulatory Visit: Payer: 59

## 2017-05-15 ENCOUNTER — Ambulatory Visit (INDEPENDENT_AMBULATORY_CARE_PROVIDER_SITE_OTHER): Payer: 59

## 2017-05-15 DIAGNOSIS — Z1231 Encounter for screening mammogram for malignant neoplasm of breast: Secondary | ICD-10-CM | POA: Diagnosis not present

## 2017-05-15 NOTE — Progress Notes (Signed)
Call pt: normal mammogram. Follow up in 1 year.

## 2017-06-19 ENCOUNTER — Encounter: Payer: Self-pay | Admitting: Physician Assistant

## 2017-06-19 ENCOUNTER — Ambulatory Visit (INDEPENDENT_AMBULATORY_CARE_PROVIDER_SITE_OTHER): Payer: 59 | Admitting: Physician Assistant

## 2017-06-19 VITALS — BP 107/61 | HR 68 | Wt 144.0 lb

## 2017-06-19 DIAGNOSIS — N760 Acute vaginitis: Secondary | ICD-10-CM | POA: Diagnosis not present

## 2017-06-19 LAB — LIPID PANEL
CHOLESTEROL: 134 (ref 0–200)
HDL: 76 — AB (ref 35–70)
LDL CALC: 43
LDL/HDL RATIO: 1.8
TRIGLYCERIDES: 76 (ref 40–160)

## 2017-06-19 LAB — BASIC METABOLIC PANEL: Glucose: 88

## 2017-06-19 LAB — WET PREP FOR TRICH, YEAST, CLUE

## 2017-06-19 LAB — HEMOGLOBIN A1C: Hemoglobin A1C: 4.8

## 2017-06-19 MED ORDER — FLUCONAZOLE 150 MG PO TABS: 150.0000 mg | ORAL_TABLET | Freq: Once | ORAL | 0 refills | Status: AC

## 2017-06-19 NOTE — Patient Instructions (Signed)

## 2017-06-19 NOTE — Progress Notes (Addendum)
   Subjective:    Patient ID: Betty Stuart, female    DOB: Nov 19, 1960, 56 y.o.   MRN: [id number]  HPI The patient is a 56 yo female who presents to the clinic today with a chief complaint of vaginal itching and discharge off and on for 1 month. She reports a history of frequent vaginal yeast infections. She tried OTC Monistat but, the symptoms did not fully resolve. Denies pain with urination, vaginal pain, blood, fever, or chills. Patient reports that she is postmenopausal. Patient also has a history of uterine fibroids.    .. Active Ambulatory Problems    Diagnosis Date Noted  . Panic attack 12/01/2014  . GAD (generalized anxiety disorder) 12/01/2014  . Depression 12/01/2014  . 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 Insomnia 12/01/2014  . Uterine fibroid 12/01/2014  . Lumbar herniated disc 06/01/2015  . History of cervical dysplasia 11/29/2015  . 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 Toenail fungus 03/13/2017   Resolved Ambulatory Problems    Diagnosis Date Noted  . No Resolved Ambulatory Problems   No Additional Past Medical History       Review of Systems  All other systems reviewed and are negative.      Objective:   Physical Exam  Constitutional: She appears well-developed and well-nourished.  HENT:  Head: Normocephalic and atraumatic.  Cardiovascular: Normal rate, regular rhythm and normal heart sounds.   Abdominal: Soft. Bowel sounds are normal. She exhibits no distension and no mass. There is no tenderness. There is no rebound and no guarding.  Psychiatric: She has a normal mood and affect. Her behavior is normal.          Assessment & Plan:  .Marland KitchenArrie Stuart was seen today for vaginitis.  Diagnoses and all orders for this visit:  Acute vaginitis -     WET PREP FOR TRICH, YEAST, CLUE -     fluconazole (DIFLUCAN) 150 MG tablet; Take 1 tablet (150 mg total) by mouth once. Repeat in 48-72 hours.   Will call with results.  diflucan given to use empirically to treat  yeast infection. Follow up if symptoms persists.  HO given.   .. Results for orders placed or performed in visit on 06/19/17  WET PREP FOR [place], YEAST, CLUE  Result Value Ref Range   Source: VAGINA    RESULT     Positive for clue cells. Sent metronidazole. Discussed not need for diflucan. Follow up with any discharge after this clears.

## 2017-06-20 ENCOUNTER — Telehealth: Payer: Self-pay | Admitting: *Deleted

## 2017-06-20 MED ORDER — METRONIDAZOLE 500 MG PO TABS: 500.0000 mg | ORAL_TABLET | Freq: Two times a day (BID) | ORAL | 0 refills | Status: AC

## 2017-06-20 NOTE — Telephone Encounter (Signed)
So phone note

## 2017-07-17 ENCOUNTER — Encounter: Payer: Self-pay | Admitting: Physician Assistant

## 2017-09-13 ENCOUNTER — Encounter: Payer: Self-pay | Admitting: Physician Assistant

## 2017-09-13 ENCOUNTER — Ambulatory Visit (INDEPENDENT_AMBULATORY_CARE_PROVIDER_SITE_OTHER): Payer: 59 | Admitting: Physician Assistant

## 2017-09-13 DIAGNOSIS — F5101 Primary insomnia: Secondary | ICD-10-CM | POA: Diagnosis not present

## 2017-09-13 DIAGNOSIS — B001 Herpesviral vesicular dermatitis: Secondary | ICD-10-CM | POA: Diagnosis not present

## 2017-09-13 DIAGNOSIS — F3340 Major depressive disorder, recurrent, in remission, unspecified: Secondary | ICD-10-CM | POA: Diagnosis not present

## 2017-09-13 DIAGNOSIS — F411 Generalized anxiety disorder: Secondary | ICD-10-CM | POA: Diagnosis not present

## 2017-09-13 MED ORDER — CITALOPRAM HYDROBROMIDE 40 MG PO TABS: 40.0000 mg | ORAL_TABLET | Freq: Every day | ORAL | 3 refills | Status: DC

## 2017-09-13 MED ORDER — ESZOPICLONE 3 MG PO TABS: 3.0000 mg | ORAL_TABLET | Freq: Every day | ORAL | 5 refills | Status: DC

## 2017-09-13 MED ORDER — ACYCLOVIR 400 MG PO TABS: 400.0000 mg | ORAL_TABLET | Freq: Two times a day (BID) | ORAL | 3 refills | Status: DC

## 2017-09-15 NOTE — Progress Notes (Signed)
   Subjective:    Patient ID: Betty Stuart, female    DOB: 03-06-1961, 56 y.o.   MRN: [id number]  HPI Pt is a 56 yo female who presents to the clinic for 6 month follow up.   Depression/anxiety- doing well no concerns or complaints. Denies any suicidal or homicidal thoughts. She rarely uses ativan. She does not need refill. She has many left.   Insomnia- sleeping well with lunesta. No side effects.   Recurrent cold sores- she takes daily suppressive therapy. She has had no outbreaks.   .. Active Ambulatory Problems    Diagnosis Date Noted  . Panic attack 12/01/2014  . GAD (generalized anxiety disorder) 12/01/2014  . Depression 12/01/2014  . 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 Insomnia 12/01/2014  . Uterine fibroid 12/01/2014  . Lumbar herniated disc 06/01/2015  . History of cervical dysplasia 11/29/2015  . 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 Toenail fungus 03/13/2017   Resolved Ambulatory Problems    Diagnosis Date Noted  . No Resolved Ambulatory Problems   No Additional Past Medical History      Review of Systems  All other systems reviewed and are negative.      Objective:   Physical Exam  Constitutional: She is oriented to person, place, and time. She appears well-developed and well-nourished.  HENT:  Head: Normocephalic and atraumatic.  Cardiovascular: Normal rate, regular rhythm and normal heart sounds.  Pulmonary/Chest: Effort normal and breath sounds normal.  Neurological: She is alert and oriented to person, place, and time.  Psychiatric: She has a normal mood and affect. Her behavior is normal.          Assessment & Plan:  Marland KitchenMarland KitchenDawn was seen today for depression and insomnia.  Diagnoses and all orders for this visit:  Recurrent major depressive disorder, in remission ([place]) -     citalopram (CELEXA) 40 MG tablet; Take 1 tablet (40 mg total) by mouth daily.  GAD (generalized anxiety disorder) -     citalopram (CELEXA) 40 MG tablet; Take 1 tablet  (40 mg total) by mouth daily.  Primary insomnia -     Eszopiclone 3 MG TABS; Take 1 tablet (3 mg total) by mouth at bedtime. Take immediately before bedtime  Recurrent cold sores -     acyclovir (ZOVIRAX) 400 MG tablet; Take 1 tablet (400 mg total) by mouth 2 (two) times daily.   Follow up in 6 months.

## 2018-03-10 ENCOUNTER — Ambulatory Visit (INDEPENDENT_AMBULATORY_CARE_PROVIDER_SITE_OTHER): Payer: 59 | Admitting: Physician Assistant

## 2018-03-10 ENCOUNTER — Encounter: Payer: Self-pay | Admitting: Physician Assistant

## 2018-03-10 VITALS — BP 102/58 | HR 81 | Ht 68.0 in | Wt 145.0 lb

## 2018-03-10 DIAGNOSIS — F3340 Major depressive disorder, recurrent, in remission, unspecified: Secondary | ICD-10-CM

## 2018-03-10 DIAGNOSIS — F410 Panic disorder [episodic paroxysmal anxiety] without agoraphobia: Secondary | ICD-10-CM | POA: Diagnosis not present

## 2018-03-10 DIAGNOSIS — B0050 Herpesviral ocular disease, unspecified: Secondary | ICD-10-CM | POA: Diagnosis not present

## 2018-03-10 DIAGNOSIS — N898 Other specified noninflammatory disorders of vagina: Secondary | ICD-10-CM | POA: Diagnosis not present

## 2018-03-10 DIAGNOSIS — B001 Herpesviral vesicular dermatitis: Secondary | ICD-10-CM | POA: Diagnosis not present

## 2018-03-10 DIAGNOSIS — F5101 Primary insomnia: Secondary | ICD-10-CM

## 2018-03-10 DIAGNOSIS — F411 Generalized anxiety disorder: Secondary | ICD-10-CM | POA: Diagnosis not present

## 2018-03-10 MED ORDER — ESZOPICLONE 3 MG PO TABS: 3.0000 mg | ORAL_TABLET | Freq: Every day | ORAL | 5 refills | Status: DC

## 2018-03-10 MED ORDER — ACYCLOVIR 400 MG PO TABS: 400.0000 mg | ORAL_TABLET | Freq: Two times a day (BID) | ORAL | 3 refills | Status: DC

## 2018-03-10 MED ORDER — LORAZEPAM 0.5 MG PO TABS: 0.5000 mg | ORAL_TABLET | Freq: Three times a day (TID) | ORAL | 1 refills | Status: DC | PRN

## 2018-03-10 MED ORDER — CITALOPRAM HYDROBROMIDE 40 MG PO TABS: 40.0000 mg | ORAL_TABLET | Freq: Every day | ORAL | 3 refills | Status: DC

## 2018-03-10 NOTE — Progress Notes (Signed)
Subjective:    Patient ID: Betty Stuart, female    DOB: August 26, 1961, 57 y.o.   MRN: [id number]  HPI Pt is a 57 yo pleasant female with Depression, anxiety, insomnia and panic attacks who presents to the clinic for medication follow up.   She does feel like her depression and anxiety has worsened some. Betty Stuart was a really bad month. Her husband could be losing his job and he has lots of medical bills. She is not exercising because of a new puppy and that takes her lunch break where she used to exercise. She is working 12 hours a day, 5 days a week. Her sleep is fairly good. She occasionally will still have problems with only lunesta. rarley uses ativan but needs refill.     .. Active Ambulatory Problems    Diagnosis Date Noted  . Panic attack 12/01/2014  . GAD (generalized anxiety disorder) 12/01/2014  . Depression 12/01/2014  . 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 Insomnia 12/01/2014  . Uterine fibroid 12/01/2014  . Lumbar herniated disc 06/01/2015  . History of cervical dysplasia 11/29/2015  . 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 Toenail fungus 03/13/2017   Resolved Ambulatory Problems    Diagnosis Date Noted  . No Resolved Ambulatory Problems   No Additional Past Medical History        Review of Systems  All other systems reviewed and are negative.      Objective:   Physical Exam  Constitutional: She is oriented to person, place, and time. She appears well-developed and well-nourished.  HENT:  Head: Normocephalic and atraumatic.  Cardiovascular: Normal rate and regular rhythm.  Pulmonary/Chest: Effort normal and breath sounds normal.  Neurological: She is alert and oriented to person, place, and time.  Psychiatric: She has a normal mood and affect. Her behavior is normal.          Assessment & Plan:   Marland KitchenMarland KitchenDawn was seen today for follow-up.  Diagnoses and all orders for this visit:  Recurrent major depressive disorder, in remission ([place]) -     citalopram  (CELEXA) 40 MG tablet; Take 1 tablet (40 mg total) by mouth daily.  GAD (generalized anxiety disorder) -     citalopram (CELEXA) 40 MG tablet; Take 1 tablet (40 mg total) by mouth daily. -     LORazepam (ATIVAN) 0.5 MG tablet; Take 1 tablet (0.5 mg total) by mouth every 8 (eight) hours as needed for anxiety.  Primary insomnia -     Eszopiclone 3 MG TABS; Take 1 tablet (3 mg total) by mouth at bedtime. Take immediately before bedtime  Recurrent cold sores -     acyclovir (ZOVIRAX) 400 MG tablet; Take 1 tablet (400 mg total) by mouth 2 (two) times daily.  Vaginal discharge -     WET PREP FOR TRICH, YEAST, CLUE  Herpes simplex of eye  Panic attack    .Marland Kitchen Depression screen [facility] 2/9 03/10/2018 09/13/2017 06/19/2017 03/13/2017 03/13/2017  Decreased Interest 1 0 1 0 0  Down, Depressed, Hopeless 1 0 1 1 0  PHQ - 2 Score 2 0 2 1 0  Altered sleeping 1 1 1  0 -  Tired, decreased energy 0 1 1 0 -  Change in appetite 1 0 0 0 -  Feeling bad or failure about yourself  2 0 0 0 -  Trouble concentrating 1 1 1  - -  Moving slowly or fidgety/restless 0 0 0 0 -  Suicidal thoughts - 0 0 0 -  PHQ-9 Score 7 3 5 1  -  Difficult doing work/chores Somewhat difficult Not difficult at all - - -   .Marland Kitchen GAD 7 : Generalized Anxiety Score 03/10/2018 09/13/2017 03/13/2017 10/09/2016  Nervous, Anxious, on Edge 1 1 0 0  Control/stop worrying 1 0 0 0  Worry too much - different things 1 0 1 1  Trouble relaxing 1 0 0 0  Restless 1 0 0 0  Easily annoyed or irritable 2 1 1  0  Afraid - awful might happen 0 0 0 0  Total GAD 7 Score 7 2 2 1   Anxiety Difficulty Somewhat difficult - Not difficult at all Not difficult at all   Refilled medications. No changes made. Encouraged counseling. Pt declined at this time. Strongly urged her to consider finding time for exercise. Discussed good sleep hygiene.   Screening labs up to date.   Follow up in 6 months.

## 2018-03-11 LAB — TIQ-MISC

## 2018-03-11 NOTE — Progress Notes (Signed)
Vaginal swab.

## 2018-03-14 NOTE — Progress Notes (Signed)
Call pt: wet prep is normal. Discharge appears to be a normal variant if continues to be concerning let me know. If odorus let me know.

## 2018-03-24 LAB — WET PREP FOR TRICH, YEAST, CLUE
MICRO NUMBER:: 90693227
Specimen Quality: ADEQUATE

## 2018-06-18 LAB — LIPID PANEL
Cholesterol: 173 (ref 0–200)
HDL: 83 — AB (ref 35–70)
LDL Cholesterol: 77
Triglycerides: 64 (ref 40–160)

## 2018-06-18 LAB — HEMOGLOBIN A1C: Hemoglobin A1C: 5.2

## 2018-06-18 LAB — BASIC METABOLIC PANEL: Glucose: 79

## 2018-08-22 ENCOUNTER — Encounter: Payer: Self-pay | Admitting: Physician Assistant

## 2018-08-22 ENCOUNTER — Other Ambulatory Visit: Payer: Self-pay | Admitting: Physician Assistant

## 2018-08-22 ENCOUNTER — Ambulatory Visit (INDEPENDENT_AMBULATORY_CARE_PROVIDER_SITE_OTHER): Payer: 59 | Admitting: Physician Assistant

## 2018-08-22 VITALS — BP 105/72 | HR 72 | Ht 68.0 in | Wt 142.0 lb

## 2018-08-22 DIAGNOSIS — F5101 Primary insomnia: Secondary | ICD-10-CM | POA: Diagnosis not present

## 2018-08-22 DIAGNOSIS — F411 Generalized anxiety disorder: Secondary | ICD-10-CM

## 2018-08-22 DIAGNOSIS — N898 Other specified noninflammatory disorders of vagina: Secondary | ICD-10-CM | POA: Diagnosis not present

## 2018-08-22 MED ORDER — FLUCONAZOLE 150 MG PO TABS: 150.0000 mg | ORAL_TABLET | Freq: Once | ORAL | 0 refills | Status: AC

## 2018-08-22 MED ORDER — ESZOPICLONE 3 MG PO TABS: 3.0000 mg | ORAL_TABLET | Freq: Every day | ORAL | 5 refills | Status: DC

## 2018-08-22 MED ORDER — LORAZEPAM 0.5 MG PO TABS: 0.5000 mg | ORAL_TABLET | Freq: Three times a day (TID) | ORAL | 1 refills | Status: DC | PRN

## 2018-08-22 NOTE — Progress Notes (Signed)
Acute Office Visit  Subjective:    Patient ID: Betty Stuart, female    DOB: December 26, 1960, 57 y.o.   MRN: [id number]  Chief Complaint  Patient presents with  . Vaginal Discharge  . Vaginal Itching    HPI Patient is a 57 year old female with a history of depression, anxiety, and cervical dysplasia presenting today with complaints of vaginal itching and copious discharge. Symptoms onset 5 days ago. Patient complains of internal vaginal itching and copious white discharge. She denies any external itching or odorous discharge, though she does admit that her sense of smell is not great. She also denies any dysuria. She is not currently sexually active. She does have a history of yeast infections and typically takes OTC treatment, however she is unable to insert tx anymore and prefers to use oral treatment instead.   She wishes to have her lunesta and ativan refilled as well. She takes both mostly due to sleeping issues. She occasionally will take an ativan during the day for anxiety.   No past medical history on file.  Past Surgical History:  Procedure Laterality Date  . BUNIONECTOMY    . CESAREAN SECTION    . herniated disk      Family History  Problem Relation Age of Onset  . Alcohol abuse Maternal Uncle   . Alcohol abuse Paternal Uncle     Social History   Socioeconomic History  . Marital status: Married    Spouse name: Not on file  . Number of children: Not on file  . Years of education: Not on file  . Highest education level: Not on file  Occupational History  . Not on file  Social Needs  . Financial resource strain: Not on file  . Food insecurity:    Worry: Not on file    Inability: Not on file  . Transportation needs:    Medical: Not on file    Non-medical: Not on file  Tobacco Use  . Smoking status: Never Smoker  . Smokeless tobacco: Never Used  Substance and Sexual Activity  . Alcohol use: Yes    Alcohol/week: 1.0 standard drinks    Types: 1 Standard  drinks or equivalent per week  . Drug use: No  . Sexual activity: Not Currently    Partners: Male  Lifestyle  . Physical activity:    Days per week: Not on file    Minutes per session: Not on file  . Stress: Not on file  Relationships  . Social connections:    Talks on phone: Not on file    Gets together: Not on file    Attends religious service: Not on file    Active member of club or organization: Not on file    Attends meetings of clubs or organizations: Not on file    Relationship status: Not on file  . Intimate partner violence:    Fear of current or ex partner: Not on file    Emotionally abused: Not on file    Physically abused: Not on file    Forced sexual activity: Not on file  Other Topics Concern  . Not on file  Social History Narrative  . Not on file    Outpatient Medications Prior to Visit  Medication Sig Dispense Refill  . Acetaminophen (TYLENOL 8 HOUR PO) Take by mouth as needed.    Marland Kitchen acyclovir (ZOVIRAX) 400 MG tablet Take 1 tablet (400 mg total) by mouth 2 (two) times daily. 180 tablet 3  .  citalopram (CELEXA) 40 MG tablet Take 1 tablet (40 mg total) by mouth daily. 90 tablet 3  . ibuprofen (ADVIL,MOTRIN) 100 MG tablet Take 100 mg by mouth as needed for fever.    . Inulin (FIBER CHOICE PO) Take by mouth.    . Melatonin 10 MG CAPS Take by mouth at bedtime.    . Eszopiclone 3 MG TABS Take 1 tablet (3 mg total) by mouth at bedtime. Take immediately before bedtime 30 tablet 5  . fluticasone (FLONASE) 50 MCG/ACT nasal spray Place 1 spray into both nostrils daily.    Marland Kitchen LORazepam (ATIVAN) 0.5 MG tablet Take 1 tablet (0.5 mg total) by mouth every 8 (eight) hours as needed for anxiety. 90 tablet 1  . Multiple Vitamin (MULTIVITAMIN) tablet Take 1 tablet by mouth daily.    . Probiotic Product (PROBIOTIC MATURE ADULT PO) Take by mouth.     No facility-administered medications prior to visit.     Allergies  Allergen Reactions  . Hydrocodone Nausea And Vomiting  .  Tramadol Nausea And Vomiting  . Zoloft [Sertraline Hcl] Rash    Review of Systems  Constitutional: Negative for chills and fever.  Genitourinary:       See HPI.       Objective:    Physical Exam  Constitutional: She appears well-developed and well-nourished. No distress.  Cardiovascular: Normal rate and regular rhythm. Exam reveals no gallop and no friction rub.  No murmur heard. Pulmonary/Chest: Effort normal and breath sounds normal.  Genitourinary: Vaginal discharge found.    BP 105/72   Pulse 72   Ht 5\' 8"  (1.727 m)   Wt 64.4 kg   LMP 06/07/2014   BMI 21.59 kg/m  Wt Readings from Last 3 Encounters:  08/22/18 64.4 kg  03/10/18 65.8 kg  09/13/17 64.9 kg    Health Maintenance Due  Topic Date Due  . COLONOSCOPY  04/23/2013    There are no preventive care reminders to display for this patient.   No results found for: TSH No results found for: WBC, HGB, HCT, MCV, PLT Lab Results  Component Value Date   NA 143 06/01/2015   K 4.2 06/01/2015   CO2 28 06/01/2015   GLUCOSE 89 06/01/2015   BUN 15 06/01/2015   CREATININE 0.85 06/01/2015   BILITOT 0.3 06/01/2015   ALKPHOS 46 06/01/2015   AST 18 06/01/2015   ALT 16 06/01/2015   PROT 6.3 06/01/2015   ALBUMIN 4.3 06/01/2015   CALCIUM 9.3 06/01/2015   Lab Results  Component Value Date   CHOL 134 06/19/2017   Lab Results  Component Value Date   HDL 76 (A) 06/19/2017   Lab Results  Component Value Date   LDLCALC 43 06/19/2017   Lab Results  Component Value Date   TRIG 76 06/19/2017   No results found for: CHOLHDL Lab Results  Component Value Date   HGBA1C 4.8 06/19/2017       Assessment & Plan:   Problem List Items Addressed This Visit      Other   GAD (generalized anxiety disorder)   Relevant Medications   LORazepam (ATIVAN) 0.5 MG tablet   Insomnia   Relevant Medications   Eszopiclone 3 MG TABS    Other Visit Diagnoses    Vaginal itching    -  Primary   Relevant Orders   Wet prep,  genital   Discharge from the vagina       Relevant Orders   Wet prep, genital  Meds ordered this encounter  Medications  . fluconazole (DIFLUCAN) 150 MG tablet    Sig: Take 1 tablet (150 mg total) by mouth once for 1 dose. Repeat in 48-72 hours if symptoms persist.    Dispense:  2 tablet    Refill:  0    Order Specific Question:   Supervising Provider    Answer:   Beatrice Lecher D [[id number]]  . Eszopiclone 3 MG TABS    Sig: Take 1 tablet (3 mg total) by mouth at bedtime. Take immediately before bedtime    Dispense:  30 tablet    Refill:  5    Order Specific Question:   Supervising Provider    Answer:   Beatrice Lecher D [[id number]]  . LORazepam (ATIVAN) 0.5 MG tablet    Sig: Take 1 tablet (0.5 mg total) by mouth every 8 (eight) hours as needed for anxiety.    Dispense:  90 tablet    Refill:  1    Order Specific Question:   Supervising Provider    Answer:   Hali Marry [[id number]]   Will send off for wet prep. empirically treated for yeast with diflucan.   Discussed sleep hygiene and how ativan can worsen ability to sleep. Discussed CBD oil and more natural options with lunesta. Refilled today.   She brings in labs from work. Reviewed and look amazing. Will abstract.   Caryl Never, Student-PA   .Vernetta Honey PA-C, have reviewed and agree with the above documentation in it's entirety.

## 2018-08-26 LAB — WET PREP FOR TRICH, YEAST, CLUE
MICRO NUMBER:: 91411013
Specimen Quality: ADEQUATE

## 2018-09-09 ENCOUNTER — Ambulatory Visit: Payer: 59 | Admitting: Physician Assistant

## 2018-09-15 ENCOUNTER — Other Ambulatory Visit: Payer: Self-pay | Admitting: Physician Assistant

## 2018-09-15 DIAGNOSIS — F5101 Primary insomnia: Secondary | ICD-10-CM

## 2018-09-17 MED ORDER — METRONIDAZOLE 500 MG PO TABS: 500.0000 mg | ORAL_TABLET | Freq: Two times a day (BID) | ORAL | 0 refills | Status: DC

## 2018-09-17 NOTE — Progress Notes (Signed)
Not sure why this is resulting 3 weeks later;however, clue cells seen. Need to treat BV as well. Sent metronidazole to pharmacy.

## 2018-09-17 NOTE — Addendum Note (Signed)
Addended by: Donella Stade on: 09/17/2018 11:25 AM   Modules accepted: Orders

## 2018-10-09 ENCOUNTER — Encounter: Payer: Self-pay | Admitting: Physician Assistant

## 2019-02-20 ENCOUNTER — Telehealth (INDEPENDENT_AMBULATORY_CARE_PROVIDER_SITE_OTHER): Payer: 59 | Admitting: Physician Assistant

## 2019-02-20 ENCOUNTER — Encounter: Payer: Self-pay | Admitting: Physician Assistant

## 2019-02-20 DIAGNOSIS — F5101 Primary insomnia: Secondary | ICD-10-CM

## 2019-02-20 DIAGNOSIS — F411 Generalized anxiety disorder: Secondary | ICD-10-CM | POA: Diagnosis not present

## 2019-02-20 DIAGNOSIS — F3340 Major depressive disorder, recurrent, in remission, unspecified: Secondary | ICD-10-CM | POA: Diagnosis not present

## 2019-02-20 MED ORDER — ESZOPICLONE 3 MG PO TABS: ORAL_TABLET | ORAL | 5 refills | Status: DC

## 2019-02-20 MED ORDER — CITALOPRAM HYDROBROMIDE 40 MG PO TABS: 40.0000 mg | ORAL_TABLET | Freq: Every day | ORAL | 3 refills | Status: DC

## 2019-02-20 NOTE — Progress Notes (Signed)
Patient ID: Betty Stuart, female   DOB: 1960/10/04, 59 y.o.   MRN: [id number] .Marland KitchenVirtual Visit via Video Note  I connected with Betty Stuart on 02/20/19 at  8:10 AM EDT by a video enabled telemedicine application and verified that I am speaking with the correct person using two identifiers.  Location: Patient: home Provider: clinic   I discussed the limitations of evaluation and management by telemedicine and the availability of in person appointments. The patient expressed understanding and agreed to proceed.  History of Present Illness: Pt is a 58 yo female with insomnia and GAD who calls into clinic for 6 month follow up.  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 She is doing well. No vision changes.   She is sleeping well and anxiety is ok. No SI/HC. No panic attacks. On celexa.   .. Active Ambulatory Problems    Diagnosis Date Noted  . Panic attack 12/01/2014  . GAD (generalized anxiety disorder) 12/01/2014  . Depression 12/01/2014  . 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 Insomnia 12/01/2014  . Uterine fibroid 12/01/2014  . Lumbar herniated disc 06/01/2015  . History of cervical dysplasia 11/29/2015  . 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 Toenail fungus 03/13/2017   Resolved Ambulatory Problems    Diagnosis Date Noted  . No Resolved Ambulatory Problems   No Additional Past Medical History   Reviewed med, allergy, problem list.     Observations/Objective: No acute distress.  Normal appearance.  Normal breathing.   .. Today's Vitals   02/20/19 0809  Weight: 141 lb (64 kg)   Body mass index is 21.44 kg/m.   .. Depression screen [facility] 2/9 02/20/2019 03/10/2018 09/13/2017 06/19/2017 03/13/2017  Decreased Interest 0 1 0 1 0  Down, Depressed, Hopeless 0 1 0 1 1  PHQ - 2 Score 0 2 0 2 1  Altered sleeping - 1 1 1  0  Tired, decreased energy - 0 1 1 0  Change in appetite - 1 0 0 0  Feeling bad or  failure about yourself  - 2 0 0 0  Trouble concentrating - 1 1 1  -  Moving slowly or fidgety/restless - 0 0 0 0  Suicidal thoughts - - 0 0 0  PHQ-9 Score - 7 3 5 1   Difficult doing work/chores - Somewhat difficult Not difficult at all - -   .Marland Kitchen GAD 7 : Generalized Anxiety Score 02/20/2019 03/10/2018 09/13/2017 03/13/2017  Nervous, Anxious, on Edge 1 1 1  0  Control/stop worrying 1 1 0 0  Worry too much - different things 0 1 0 1  Trouble relaxing 2 1 0 0  Restless 1 1 0 0  Easily annoyed or irritable 1 2 1 1   Afraid - awful might happen 0 0 0 0  Total GAD 7 Score 6 7 2 2   Anxiety Difficulty Not difficult at all Somewhat difficult - Not difficult at all     Assessment and Plan: Marland KitchenMarland KitchenDawn was seen today for insomnia and anxiety.  Diagnoses and all orders for this visit:  Primary insomnia -     Eszopiclone 3 MG TABS; TAKE 1 TABLET BY MOUTH IMMEDIATELY BEFORE BEDTIME  Recurrent major depressive disorder, in remission (HCC) -     citalopram (CELEXA) 40 MG tablet; Take 1 tablet (40 mg total) by mouth daily.  GAD (generalized anxiety disorder) -     citalopram (CELEXA) 40 MG tablet; Take 1 tablet (40 mg  total) by mouth daily.   Pt doing well. GAD numbers stable. Refilled medications. Follow up in 6 months.   Follow Up Instructions:    I discussed the assessment and treatment plan with the patient. The patient was provided an opportunity to ask questions and all were answered. The patient agreed with the plan and demonstrated an understanding of the instructions.   The patient was advised to call back or seek an in-person evaluation if the symptoms worsen or if the condition fails to improve as anticipated.     Iran Planas, PA-C

## 2019-08-18 ENCOUNTER — Ambulatory Visit (INDEPENDENT_AMBULATORY_CARE_PROVIDER_SITE_OTHER): Payer: 59 | Admitting: Physician Assistant

## 2019-08-18 ENCOUNTER — Other Ambulatory Visit: Payer: Self-pay

## 2019-08-18 VITALS — BP 114/64 | HR 73 | Ht 68.0 in | Wt 144.0 lb

## 2019-08-18 DIAGNOSIS — Z79899 Other long term (current) drug therapy: Secondary | ICD-10-CM

## 2019-08-18 DIAGNOSIS — Z1231 Encounter for screening mammogram for malignant neoplasm of breast: Secondary | ICD-10-CM | POA: Diagnosis not present

## 2019-08-18 DIAGNOSIS — Z1211 Encounter for screening for malignant neoplasm of colon: Secondary | ICD-10-CM | POA: Diagnosis not present

## 2019-08-18 DIAGNOSIS — F5101 Primary insomnia: Secondary | ICD-10-CM | POA: Diagnosis not present

## 2019-08-18 MED ORDER — ESZOPICLONE 3 MG PO TABS: ORAL_TABLET | ORAL | 5 refills | Status: DC

## 2019-08-18 NOTE — Progress Notes (Signed)
   Subjective:    Patient ID: Betty Stuart, female    DOB: [birth year], 58 y.o.   MRN: [id number]  HPI  Patient is a 58 year old female with insomnia and anxiety/depression who presents to the clinic for 36-month follow-up.  Patient is doing fairly well today.  She feels controlled on all her medication.  She continues to have some problems falling asleep.  She has added melatonin with her Lunesta.  She seems to wake up feeling rested.  She denies any snoring.  She has pretty good energy throughout the day.  Overall she has no concerns or complaints.  She feels like her mood is well controlled as well.  She denies any suicidal thoughts or homicidal idealizations.  .. Active Ambulatory Problems    Diagnosis Date Noted  . Panic attack 12/01/2014  . GAD (generalized anxiety disorder) 12/01/2014  . Depression 12/01/2014  . 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 Insomnia 12/01/2014  . Uterine fibroid 12/01/2014  . Lumbar herniated disc 06/01/2015  . History of cervical dysplasia 11/29/2015  . 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 Toenail fungus 03/13/2017   Resolved Ambulatory Problems    Diagnosis Date Noted  . No Resolved Ambulatory Problems   No Additional Past Medical History     Review of Systems  All other systems reviewed and are negative.      Objective:   Physical Exam Vitals signs reviewed.  Constitutional:      Appearance: Normal appearance.  HENT:     Head: Normocephalic.  Neck:     Vascular: No carotid bruit.  Cardiovascular:     Rate and Rhythm: Normal rate and regular rhythm.     Pulses: Normal pulses.  Pulmonary:     Effort: Pulmonary effort is normal.     Breath sounds: Normal breath sounds.  Lymphadenopathy:     Cervical: No cervical adenopathy.  Neurological:     General: No focal deficit present.     Mental Status: She is alert and oriented to person, place, and time.  Psychiatric:        Mood and Affect: Mood normal.        Behavior: Behavior normal.            Assessment & Plan:  Marland KitchenMarland KitchenDawn was seen today for depression and insomnia.  Diagnoses and all orders for this visit:  Primary insomnia -     Eszopiclone 3 MG TABS; TAKE 1 TABLET BY MOUTH IMMEDIATELY BEFORE BEDTIME  Encounter for screening mammogram for malignant neoplasm of breast -     MM 3D SCREEN BREAST BILATERAL  Medication management -     COMPLETE METABOLIC PANEL WITH GFR  Colon cancer screening -     Cologuard   Refilled Lunesta for 6 months. Ordered CMP for yearly labs.  She normally has labs done at work but they are not doing it during the Covid pandemic.  Her cholesterol has always been controlled. Patient declined shingles, flu, Tdap.  Patient does not want vaccines. Patient's mammogram is needed.  Referral made to go downstairs to have mammogram. Patient is also due for colonoscopy.  We have discussed this at numerous visits.  She continues to not get this done.  Mention Cologuard at this appointment.  Patient does not have any personal or family history of colon cancer.  Cologuard ordered today.  Discussed risk of not having any colon cancer screening.

## 2019-08-18 NOTE — Patient Instructions (Signed)
Suvorexant oral tablets What is this medicine? SUVOREXANT (su-vor-EX-ant) is used to treat insomnia. This medicine helps you to fall asleep and sleep through the night. This medicine may be used for other purposes; ask your health care provider or pharmacist if you have questions. COMMON BRAND NAME(S): Belsomra What should I tell my health care provider before I take this medicine? They need to know if you have any of these conditions:  depression  drink alcohol  drug abuse or addiction  feel sleepy or have fallen asleep suddenly during the day  history of a sudden onset of muscle weakness (cataplexy)  liver disease  lung or breathing disease, like asthma or emphysema  sleep apnea  suicidal thoughts, plans, or attempt; a previous suicide attempt by you or a family member  an unusual or allergic reaction to suvorexant, other medicines, foods, dyes, or preservatives  pregnant or trying to get pregnant  breast-feeding How should I use this medicine? Take this medicine by mouth within 30 minutes of going to bed. Do not take it unless you are able to stay in bed a full night before you must be active again. Follow the directions on the prescription label. You may take this medicine with or without a food. However, this medicine may take longer to work if you take it with or right after meals. Do not take your medicine more often than directed. Do not stop taking this medicine on your own. Always follow your doctor or health care professional's advice. A special MedGuide will be given to you by the pharmacist with each prescription and refill. Be sure to read this information carefully each time. Talk to your pediatrician regarding the use of this medicine in children. Special care may be needed. Overdosage: If you think you have taken too much of this medicine contact a poison control center or emergency room at once. NOTE: This medicine is only for you. Do not share this medicine with  others. What if I miss a dose? This medicine should only be taken immediately before going to sleep. Do not take double or extra doses. What may interact with this medicine?  alcohol  antihistamines for allergy, cough, or cold  aprepitant  boceprevir  certain antibiotics like ciprofloxacin, clarithromycin, erythromycin, telithromycin  certain antivirals for HIV or AIDS  certain medicines for anxiety or sleep  certain medicines for depression like amitriptyline, fluoxetine, nefazodone, sertraline  certain medicines for fungal infections like ketoconazole, posaconazole, fluconazole, itraconazole  certain medicines for seizures like carbamazepine, phenobarbital, primidone, phenytoin  conivaptan  digoxin  diltiazem  general anesthetics like halothane, isoflurane, methoxyflurane, propofol  grapefruit juice  imatinib  medicines that relax muscles for surgery  narcotic medicines for pain  phenothiazines like chlorpromazine, mesoridazine, prochlorperazine, thioridazine  rifampin  verapamil This list may not describe all possible interactions. Give your health care provider a list of all the medicines, herbs, non-prescription drugs, or dietary supplements you use. Also tell them if you smoke, drink alcohol, or use illegal drugs. Some items may interact with your medicine. What should I watch for while using this medicine? Visit your health care professional for regular checks on your progress. Tell your health care professional if your symptoms do not start to get better or if they get worse. Avoid caffeine-containing drinks in the evening hours. After taking this medicine, you may get up out of bed and do an activity that you do not know you are doing. The next morning, you may have no memory of this.  Activities include driving a car ("sleep-driving"), making and eating food, talking on the phone, sexual activity, and sleep-walking. Serious injuries have occurred. Call your  doctor right away if you find out you have done any of these activities. Do not take this medicine if you have used alcohol that evening. Do not take it if you have taken another medicine for sleep. °Do not take this medicine unless you are able to stay in bed for a full night (7 to 8 hours) and do not drive or perform other activities requiring full alertness within 8 hours of a dose. Do not drive, use machinery, or do anything that needs mental alertness the day after you take the 20 mg dose of this medicine. The use of lower doses (10 mg) may also cause driving impairment the next day. You may have a decrease in mental alertness the day after use, even if you feel that you are fully awake. Tell your doctor if you will need to perform activities requiring full alertness, such as driving, the next day. Do not stand or sit up quickly after taking this medicine, especially if you are an older patient. This reduces the risk of dizzy or fainting spells. °If you or your family notice any changes in your behavior, such as new or worsening depression, thoughts of harming yourself, anxiety, other unusual or disturbing thoughts, or memory loss, call your health care professional right away. °After you stop taking this medicine, you may have trouble falling asleep. This is called rebound insomnia. This problem usually goes away on its own after 1 or 2 nights. °What side effects may I notice from receiving this medicine? °Side effects that you should report to your doctor or health care professional as soon as possible: °· allergic reactions like skin rash, itching or hives, swelling of the face, lips, or tongue °· hallucinations °· periods of leg weakness lasting from seconds to a few minutes °· suicidal thoughts, mood changes °· unable to move or speak for several minutes while going to sleep or waking up °· unusual activities while not fully awake like driving, eating, making phone calls, or sexual activity °Side effects  that usually do not require medical attention (report these to your doctor or health care professional if they continue or are bothersome): °· daytime drowsiness °· headache °· nightmares or abnormal dreams °· tiredness °This list may not describe all possible side effects. Call your doctor for medical advice about side effects. You may report side effects to FDA at 1-800-FDA-1088. °Where should I keep my medicine? °Keep out of the reach of children. This medicine can be abused. Keep your medicine in a safe place to protect it from theft. Do not share this medicine with anyone. Selling or giving away this medicine is dangerous and against the law. °Store at room temperature between 15 and 30 degrees C (59 and 86 degrees F). Throw away any unused medicine after the expiration date. °NOTE: This sheet is a summary. It may not cover all possible information. If you have questions about this medicine, talk to your doctor, pharmacist, or health care provider. °© 2020 [employer] (2018-10-03 16:37:12) ° °

## 2019-08-21 ENCOUNTER — Encounter: Payer: Self-pay | Admitting: Physician Assistant

## 2019-08-25 LAB — COMPLETE METABOLIC PANEL WITH GFR
AG Ratio: 1.6 (calc) (ref 1.0–2.5)
ALT: 18 U/L (ref 6–29)
AST: 21 U/L (ref 10–35)
Albumin: 4.1 g/dL (ref 3.6–5.1)
Alkaline phosphatase (APISO): 54 U/L (ref 37–153)
BUN: 13 mg/dL (ref 7–25)
CO2: 29 mmol/L (ref 20–32)
Calcium: 9.5 mg/dL (ref 8.6–10.4)
Chloride: 103 mmol/L (ref 98–110)
Creat: 0.98 mg/dL (ref 0.50–1.05)
GFR, Est African American: 75 mL/min/{1.73_m2} (ref >=60)
GFR, Est Non African American: 64 mL/min/{1.73_m2} (ref >=60)
Globulin: 2.6 g/dL (calc) (ref 1.9–3.7)
Glucose, Bld: 76 mg/dL (ref 65–99)
Potassium: 4.6 mmol/L (ref 3.5–5.3)
Sodium: 140 mmol/L (ref 135–146)
Total Bilirubin: 0.5 mg/dL (ref 0.2–1.2)
Total Protein: 6.7 g/dL (ref 6.1–8.1)

## 2019-08-25 LAB — SPECIMEN COMPROMISED

## 2019-08-25 NOTE — Progress Notes (Signed)
Zani,   Looks great!

## 2019-09-10 ENCOUNTER — Ambulatory Visit (INDEPENDENT_AMBULATORY_CARE_PROVIDER_SITE_OTHER): Payer: 59

## 2019-09-10 ENCOUNTER — Other Ambulatory Visit: Payer: Self-pay

## 2019-09-10 DIAGNOSIS — Z1231 Encounter for screening mammogram for malignant neoplasm of breast: Secondary | ICD-10-CM | POA: Diagnosis not present

## 2019-09-11 NOTE — Progress Notes (Signed)
Normal mammogram. Follow up in one year.

## 2019-09-19 LAB — COLOGUARD: Cologuard: NEGATIVE

## 2019-09-30 ENCOUNTER — Encounter: Payer: Self-pay | Admitting: Physician Assistant

## 2020-02-15 ENCOUNTER — Encounter: Payer: Self-pay | Admitting: Physician Assistant

## 2020-02-15 ENCOUNTER — Telehealth (INDEPENDENT_AMBULATORY_CARE_PROVIDER_SITE_OTHER): Payer: 59 | Admitting: Physician Assistant

## 2020-02-15 ENCOUNTER — Telehealth: Payer: Self-pay | Admitting: Physician Assistant

## 2020-02-15 VITALS — Temp 97.5°F | Ht 68.0 in | Wt 140.5 lb

## 2020-02-15 DIAGNOSIS — F411 Generalized anxiety disorder: Secondary | ICD-10-CM

## 2020-02-15 DIAGNOSIS — F410 Panic disorder [episodic paroxysmal anxiety] without agoraphobia: Secondary | ICD-10-CM | POA: Diagnosis not present

## 2020-02-15 DIAGNOSIS — F5101 Primary insomnia: Secondary | ICD-10-CM | POA: Diagnosis not present

## 2020-02-15 DIAGNOSIS — F3340 Major depressive disorder, recurrent, in remission, unspecified: Secondary | ICD-10-CM | POA: Diagnosis not present

## 2020-02-15 MED ORDER — ESZOPICLONE 3 MG PO TABS: ORAL_TABLET | ORAL | 5 refills | Status: DC

## 2020-02-15 MED ORDER — LORAZEPAM 0.5 MG PO TABS: 0.5000 mg | ORAL_TABLET | Freq: Three times a day (TID) | ORAL | 0 refills | Status: DC | PRN

## 2020-02-15 MED ORDER — CITALOPRAM HYDROBROMIDE 20 MG PO TABS: 20.0000 mg | ORAL_TABLET | Freq: Every day | ORAL | 3 refills | Status: DC

## 2020-02-15 NOTE — Progress Notes (Signed)
Patient ID: Betty Stuart, female   DOB: [birth year], 59 y.o.   MRN: [id number] .Marland KitchenVirtual Visit via Telephone Note  I connected with Betty Stuart on 02/15/20 at  8:10 AM EDT by telephone and verified that I am speaking with the correct person using two identifiers.  Location: Patient: home Provider: home   I discussed the limitations, risks, security and privacy concerns of performing an evaluation and management service by telephone and the availability of in person appointments. I also discussed with the patient that there may be a patient responsible charge related to this service. The patient expressed understanding and agreed to proceed.   History of Present Illness: Pt is a 59 yo female with insomnia, MDD, GAD, panic attacks who calls into the clinic for medication refills.   Overall she is doing well. lunesta is helping with sleep. No problems or concerns. No Side effects. Needs refills.   Her anxiety and depression have worsened just a bit. She is walking every day. She did decrease celexa to 20mg  and does not want to increase it. She feels like summer and getting through the pandemic will help. No SI/HC. She has had a few panic attacks and has 2 ativan left. She uses as needed. Her last rx was 59 years old.   .. Active Ambulatory Problems    Diagnosis Date Noted  . Panic attack 12/01/2014  . GAD (generalized anxiety disorder) 12/01/2014  . Depression 12/01/2014  . 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 Insomnia 12/01/2014  . Uterine fibroid 12/01/2014  . Lumbar herniated disc 06/01/2015  . History of cervical dysplasia 11/29/2015  . 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 Toenail fungus 03/13/2017   Resolved Ambulatory Problems    Diagnosis Date Noted  . No Resolved Ambulatory Problems   No Additional Past Medical History   Reviewed med, allergy, problem list.     Observations/Objective: No acute distress Normal mood.  .. Today's Vitals   02/15/20 0815  Temp: (!) 97.5  F (36.4 C)  TempSrc: Oral  Weight: 140 lb 8 oz (63.7 kg)  Height: 5\' 8"  (1.727 m)   Body mass index is 21.36 kg/m.   .. Depression screen [facility] 2/9 02/15/2020 08/18/2019 02/20/2019 03/10/2018 09/13/2017  Decreased Interest 0 0 0 1 0  Down, Depressed, Hopeless 1 0 0 1 0  PHQ - 2 Score 1 0 0 2 0  Altered sleeping 3 1 - 1 1  Tired, decreased energy 1 1 - 0 1  Change in appetite 0 0 - 1 0  Feeling bad or failure about yourself  0 0 - 2 0  Trouble concentrating 0 0 - 1 1  Moving slowly or fidgety/restless 0 0 - 0 0  Suicidal thoughts 0 0 - - 0  PHQ-9 Score 5 2 - 7 3  Difficult doing work/chores Not difficult at all Not difficult at all - Somewhat difficult Not difficult at all  Some recent data might be hidden   .Marland Kitchen GAD 7 : Generalized Anxiety Score 02/15/2020 08/18/2019 02/20/2019 03/10/2018  Nervous, Anxious, on Edge 1 1 1 1   Control/stop worrying 1 1 1 1   Worry too much - different things 1 0 0 1  Trouble relaxing 1 0 2 1  Restless 1 0 1 1  Easily annoyed or irritable 3 1 1 2   Afraid - awful might happen 0 0 0 0  Total GAD 7 Score 8 3 6 7   Anxiety Difficulty Not difficult at all Not difficult at all Not  difficult at all Somewhat difficult     Assessment and Plan: Marland KitchenMarland KitchenDawn was seen today for depression.  Diagnoses and all orders for this visit:  Primary insomnia -     Eszopiclone 3 MG TABS; TAKE 1 TABLET BY MOUTH IMMEDIATELY BEFORE BEDTIME  Recurrent major depressive disorder, in remission (HCC) -     citalopram (CELEXA) 20 MG tablet; Take 1 tablet (20 mg total) by mouth daily.  GAD (generalized anxiety disorder) -     citalopram (CELEXA) 20 MG tablet; Take 1 tablet (20 mg total) by mouth daily.  Panic attack -     citalopram (CELEXA) 20 MG tablet; Take 1 tablet (20 mg total) by mouth daily. -     LORazepam (ATIVAN) 0.5 MG tablet; Take 1 tablet (0.5 mg total) by mouth every 8 (eight) hours as needed for anxiety.    Medication refills sent.  Follow up in 6 months.    Needs pap.  Encouraged covid vaccine.   Follow Up Instructions:    I discussed the assessment and treatment plan with the patient. The patient was provided an opportunity to ask questions and all were answered. The patient agreed with the plan and demonstrated an understanding of the instructions.   The patient was advised to call back or seek an in-person evaluation if the symptoms worsen or if the condition fails to improve as anticipated.  I provided 20 minutes of non-face-to-face time during this encounter.   Iran Planas, PA-C

## 2020-02-15 NOTE — Telephone Encounter (Signed)
Received fax for PA on Lorazepam sent through cover my meds waiting on determination. - CF

## 2020-02-15 NOTE — Progress Notes (Signed)
Patient Scheduled

## 2020-02-15 NOTE — Progress Notes (Signed)
Needs refills of Citalopram, only been taking half (20 mg). PHQ9-GAD7 completed.

## 2020-02-26 NOTE — Telephone Encounter (Signed)
Received denial for Lorazepam. Mychart message sent to patient.

## 2020-06-07 ENCOUNTER — Telehealth: Payer: Self-pay | Admitting: Neurology

## 2020-06-07 NOTE — Telephone Encounter (Signed)
Yes we can remove.

## 2020-06-07 NOTE — Telephone Encounter (Signed)
Patient seen 06/05/2020 in ER for head laceration. Looks like they did do CT scan. She called and left vm to see if we could remove sutures in one week.   D/C instructions state: [facility] In 1 week.  Specialty: Emergency Medicine Comments: As needed, If symptoms worsen, For suture removal  Patient doesn't want to return to ER for suture removal, okay to schedule here?

## 2020-06-07 NOTE — Telephone Encounter (Signed)
Scheduled with patient.

## 2020-06-13 ENCOUNTER — Encounter: Payer: Self-pay | Admitting: Physician Assistant

## 2020-06-13 ENCOUNTER — Other Ambulatory Visit: Payer: Self-pay

## 2020-06-13 ENCOUNTER — Ambulatory Visit (INDEPENDENT_AMBULATORY_CARE_PROVIDER_SITE_OTHER): Payer: 59 | Admitting: Physician Assistant

## 2020-06-13 VITALS — BP 99/54 | HR 87 | Ht 68.0 in | Wt 143.0 lb

## 2020-06-13 DIAGNOSIS — Z4802 Encounter for removal of sutures: Secondary | ICD-10-CM

## 2020-06-13 DIAGNOSIS — S0101XD Laceration without foreign body of scalp, subsequent encounter: Secondary | ICD-10-CM

## 2020-06-13 NOTE — Progress Notes (Signed)
   Subjective:    Patient ID: Betty Stuart, female    DOB: 03/21/1961, 59 y.o.   MRN: [id number]  HPI  Pt is a 59 yo female who presents to the clinic to have staples removed after laceration to scalp from head injury involving her dogs. She went to ED on 06/05/2020. CT negative and no fractures to head or neck. No LOC. She has no concussion symptoms. She has some head tenderness over area of laceration. Ibuprofen and tylenol seem to help.   .. Active Ambulatory Problems    Diagnosis Date Noted  . Panic attack 12/01/2014  . GAD (generalized anxiety disorder) 12/01/2014  . Depression 12/01/2014  . 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 Insomnia 12/01/2014  . Uterine fibroid 12/01/2014  . Lumbar herniated disc 06/01/2015  . History of cervical dysplasia 11/29/2015  . 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 Toenail fungus 03/13/2017   Resolved Ambulatory Problems    Diagnosis Date Noted  . No Resolved Ambulatory Problems   No Additional Past Medical History      Review of Systems  Constitutional: Negative for fatigue.  Neurological: Negative for syncope, weakness, light-headedness and headaches.  Psychiatric/Behavioral: Negative for confusion and decreased concentration.  All other systems reviewed and are negative.      Objective:   Physical Exam HENT:     Head:     Comments: Left posterior laceration-healing well with good scab formation. No surrounding erythema or discharge. 2 staples.  Cardiovascular:     Rate and Rhythm: Normal rate.  Musculoskeletal:        General: Normal range of motion.     Comments: NROM of head and neck.  No c-spine tenderness.   Neurological:     General: No focal deficit present.     Mental Status: She is oriented to person, place, and time.  Psychiatric:        Mood and Affect: Mood normal.           Assessment & Plan:  Marland KitchenMarland KitchenDawn was seen today for suture / staple removal.  Diagnoses and all orders for this visit:  Encounter for  removal of staples  Laceration of scalp, subsequent encounter   Doing well. Healing appropriately. No problems. Staples removed (2) without difficulty. Follow up as needed. Discussed keeping wound clean and dry.

## 2020-06-27 ENCOUNTER — Other Ambulatory Visit: Payer: Self-pay

## 2020-06-27 ENCOUNTER — Encounter: Payer: Self-pay | Admitting: Physician Assistant

## 2020-06-27 ENCOUNTER — Ambulatory Visit (INDEPENDENT_AMBULATORY_CARE_PROVIDER_SITE_OTHER): Payer: 59 | Admitting: Physician Assistant

## 2020-06-27 VITALS — BP 121/74 | HR 76 | Ht 68.0 in | Wt 141.0 lb

## 2020-06-27 DIAGNOSIS — F411 Generalized anxiety disorder: Secondary | ICD-10-CM | POA: Diagnosis not present

## 2020-06-27 DIAGNOSIS — F5101 Primary insomnia: Secondary | ICD-10-CM

## 2020-06-27 DIAGNOSIS — F3340 Major depressive disorder, recurrent, in remission, unspecified: Secondary | ICD-10-CM

## 2020-06-27 DIAGNOSIS — F410 Panic disorder [episodic paroxysmal anxiety] without agoraphobia: Secondary | ICD-10-CM

## 2020-06-27 DIAGNOSIS — F4323 Adjustment disorder with mixed anxiety and depressed mood: Secondary | ICD-10-CM | POA: Diagnosis not present

## 2020-06-27 MED ORDER — TRAZODONE HCL 50 MG PO TABS: ORAL_TABLET | ORAL | 1 refills | Status: DC

## 2020-06-27 NOTE — Patient Instructions (Signed)
Trazodone for sleep 1-2 tablets at bedtime.  Increase celexa to 20mg .

## 2020-06-27 NOTE — Progress Notes (Addendum)
Subjective:    Patient ID: Betty Stuart, female    DOB: 31-Aug-1961, 59 y.o.   MRN: [id number]  HPI 59 year old female presenting to talk about her sleep and anxiety.  Pt has a hx of anxiety, depression, panic attacks. She is experiencing increased stress with her home life and feels that she needs to titrate back up on her anxiety/depression meds. She had been trying to taper off and use more natural products but these are not working for her at this time. She is having a difficult time sleeping as a result and is having problems going to sleep and staying asleep. She gets about 6 hours of interrupted non-restorative sleep after taking eszopiclone, with lorazepam and a benadryl. She acknowledges that this is not something she wants to do regularly and wants to try another medication to help with sleep.   She denies any suicidal or homicidal ideations  .Marland Kitchen Active Ambulatory Problems    Diagnosis Date Noted  . Panic attack 12/01/2014  . GAD (generalized anxiety disorder) 12/01/2014  . Depression 12/01/2014  . 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 Insomnia 12/01/2014  . Uterine fibroid 12/01/2014  . Lumbar herniated disc 06/01/2015  . History of cervical dysplasia 11/29/2015  . 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 Toenail fungus 03/13/2017   Resolved Ambulatory Problems    Diagnosis Date Noted  . No Resolved Ambulatory Problems   No Additional Past Medical History    Review of Systems  Constitutional: Negative for chills and fever.  Respiratory: Negative for cough and shortness of breath.   Cardiovascular: Negative for chest pain and palpitations.       Objective:   Physical Exam Vitals reviewed.  Constitutional:      Appearance: Normal appearance.  Cardiovascular:     Rate and Rhythm: Normal rate and regular rhythm.     Pulses: Normal pulses.  Pulmonary:     Effort: Pulmonary effort is normal.  Neurological:     General: No focal deficit present.     Mental Status: She  is alert and oriented to person, place, and time.  Psychiatric:        Mood and Affect: Mood normal.     .. Depression screen [facility] 2/9 06/27/2020 06/13/2020 02/15/2020 08/18/2019 02/20/2019  Decreased Interest 1 0 0 0 0  Down, Depressed, Hopeless 1 1 1  0 0  PHQ - 2 Score 2 1 1  0 0  Altered sleeping 1 1 3 1  -  Tired, decreased energy 1 0 1 1 -  Change in appetite 0 0 0 0 -  Feeling bad or failure about yourself  1 0 0 0 -  Trouble concentrating 1 0 0 0 -  Moving slowly or fidgety/restless 0 0 0 0 -  Suicidal thoughts 0 0 0 0 -  PHQ-9 Score 6 2 5 2  -  Difficult doing work/chores Somewhat difficult Not difficult at all Not difficult at all Not difficult at all -  Some recent data might be hidden   .Marland Kitchen GAD 7 : Generalized Anxiety Score 06/27/2020 06/13/2020 02/15/2020 08/18/2019  Nervous, Anxious, on Edge 2 1 1 1   Control/stop worrying 2 0 1 1  Worry too much - different things 2 1 1  0  Trouble relaxing 2 0 1 0  Restless 1 0 1 0  Easily annoyed or irritable 1 0 3 1  Afraid - awful might happen 0 0 0 0  Total GAD 7 Score 10 2 8 3   Anxiety Difficulty  Somewhat difficult Not difficult at all Not difficult at all Not difficult at all          Assessment & Plan:  Marland KitchenMarland KitchenDawn was seen today for depression and insomnia.  Diagnoses and all orders for this visit:  Adjustment disorder with mixed anxiety and depressed mood -     traZODone (DESYREL) 50 MG tablet; Take 1 to 2 tablets at bedtime.  GAD (generalized anxiety disorder)  Recurrent major depressive disorder, in remission ([place])  Panic attack  Primary insomnia -     traZODone (DESYREL) 50 MG tablet; Take 1 to 2 tablets at bedtime.   Clear the patient is under a lot of stress today.  She does have some underlying anxiety, depression, panic attacks that have been controlled with Celexa 10 mg.  Go ahead and increase to 20 mg, me know how you are doing in 2 to 3 weeks.  I do think that counseling would be extremely beneficial for  patient.  She declines that today.  Since this is clearly a home wife trigger there might be something she could work out.  Sleeping continues to be an issue.  Upon further investigation it seems like even Johnnye Sima was never truly controlling her sleep.  Patient is aware that if she is not getting good rest she will not continue to improve with her anxiety and depression.  She does use lorazepam for sleep.  Discussed that benzodiazepines are not the best long-term treatment option for insomnia.  We did discuss some sleep hygiene with patient.  She has not tried trazodone. Stop lunesta. We will try trazodone 50 mg and even consider increasing up to 150 mg for sleep at bedtime.  Follow up in 6-8 weeks or sooner if needed.   Spent 30 minutes with patient.

## 2020-07-03 ENCOUNTER — Encounter: Payer: Self-pay | Admitting: Physician Assistant

## 2020-07-24 ENCOUNTER — Encounter: Payer: Self-pay | Admitting: Physician Assistant

## 2020-08-17 ENCOUNTER — Ambulatory Visit (INDEPENDENT_AMBULATORY_CARE_PROVIDER_SITE_OTHER): Payer: 59 | Admitting: Physician Assistant

## 2020-08-17 ENCOUNTER — Other Ambulatory Visit: Payer: Self-pay

## 2020-08-17 ENCOUNTER — Encounter: Payer: Self-pay | Admitting: Physician Assistant

## 2020-08-17 VITALS — BP 102/64 | HR 100 | Ht 68.0 in | Wt 139.0 lb

## 2020-08-17 DIAGNOSIS — F5101 Primary insomnia: Secondary | ICD-10-CM

## 2020-08-17 DIAGNOSIS — F3340 Major depressive disorder, recurrent, in remission, unspecified: Secondary | ICD-10-CM | POA: Diagnosis not present

## 2020-08-17 DIAGNOSIS — Z79899 Other long term (current) drug therapy: Secondary | ICD-10-CM

## 2020-08-17 DIAGNOSIS — F410 Panic disorder [episodic paroxysmal anxiety] without agoraphobia: Secondary | ICD-10-CM

## 2020-08-17 DIAGNOSIS — F411 Generalized anxiety disorder: Secondary | ICD-10-CM | POA: Diagnosis not present

## 2020-08-17 MED ORDER — ESZOPICLONE 3 MG PO TABS: 3.0000 mg | ORAL_TABLET | Freq: Every evening | ORAL | 1 refills | Status: DC | PRN

## 2020-08-17 MED ORDER — CITALOPRAM HYDROBROMIDE 20 MG PO TABS: 20.0000 mg | ORAL_TABLET | Freq: Every day | ORAL | 1 refills | Status: DC

## 2020-08-17 MED ORDER — LORAZEPAM 0.5 MG PO TABS: 0.5000 mg | ORAL_TABLET | Freq: Three times a day (TID) | ORAL | 0 refills | Status: DC | PRN

## 2020-08-17 NOTE — Progress Notes (Signed)
Subjective:    Patient ID: Betty Stuart, female    DOB: 06-14-1961, 59 y.o.   MRN: [id number]  HPI  Pt is a 59 yo female with MDD, GAD, panic attacks and insomnia who presents to the clinic for medication refills.   Pt is doing really well with her mood despite her husband being really sick and finances not doing well. Her sleep is mostly good. She still has some good and bad nights. No SI/HC needs refills.   .. Active Ambulatory Problems    Diagnosis Date Noted  . Panic attack 12/01/2014  . GAD (generalized anxiety disorder) 12/01/2014  . Depression 12/01/2014  . 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 Insomnia 12/01/2014  . Uterine fibroid 12/01/2014  . Lumbar herniated disc 06/01/2015  . History of cervical dysplasia 11/29/2015  . 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 Toenail fungus 03/13/2017   Resolved Ambulatory Problems    Diagnosis Date Noted  . No Resolved Ambulatory Problems   No Additional Past Medical History        Review of Systems  All other systems reviewed and are negative.      Objective:   Physical Exam Vitals reviewed.  Constitutional:      Appearance: Normal appearance.  HENT:     Head: Normocephalic.  Cardiovascular:     Rate and Rhythm: Normal rate and regular rhythm.     Pulses: Normal pulses.  Pulmonary:     Effort: Pulmonary effort is normal.     Breath sounds: Normal breath sounds.  Neurological:     General: No focal deficit present.     Mental Status: She is alert and oriented to person, place, and time.  Psychiatric:        Mood and Affect: Mood normal.     .. Depression screen [facility] 2/9 08/17/2020 06/27/2020 06/13/2020 02/15/2020 08/18/2019  Decreased Interest 0 1 0 0 0  Down, Depressed, Hopeless 1 1 1 1  0  PHQ - 2 Score 1 2 1 1  0  Altered sleeping 2 1 1 3 1   Tired, decreased energy 0 1 0 1 1  Change in appetite 0 0 0 0 0  Feeling bad or failure about yourself  0 1 0 0 0  Trouble concentrating 0 1 0 0 0  Moving slowly or  fidgety/restless 0 0 0 0 0  Suicidal thoughts 0 0 0 0 0  PHQ-9 Score 3 6 2 5 2   Difficult doing work/chores Not difficult at all Somewhat difficult Not difficult at all Not difficult at all Not difficult at all  Some recent data might be hidden   .Marland Kitchen GAD 7 : Generalized Anxiety Score 08/17/2020 06/27/2020 06/13/2020 02/15/2020  Nervous, Anxious, on Edge 1 2 1 1   Control/stop worrying 1 2 0 1  Worry too much - different things 1 2 1 1   Trouble relaxing 0 2 0 1  Restless 0 1 0 1  Easily annoyed or irritable 1 1 0 3  Afraid - awful might happen 0 0 0 0  Total GAD 7 Score 4 10 2 8   Anxiety Difficulty Not difficult at all Somewhat difficult Not difficult at all Not difficult at all          Assessment & Plan:  Marland KitchenMarland KitchenDawn was seen today for follow-up.  Diagnoses and all orders for this visit:  GAD (generalized anxiety disorder) -     citalopram (CELEXA) 20 MG tablet; Take 1 tablet (20 mg total) by mouth daily. -  COMPLETE METABOLIC PANEL WITH GFR  Panic attack -     LORazepam (ATIVAN) 0.5 MG tablet; Take 1 tablet (0.5 mg total) by mouth every 8 (eight) hours as needed for anxiety.  Recurrent major depressive disorder, in remission (HCC) -     citalopram (CELEXA) 20 MG tablet; Take 1 tablet (20 mg total) by mouth daily. -     COMPLETE METABOLIC PANEL WITH GFR  Primary insomnia -     Eszopiclone 3 MG TABS; Take 1 tablet (3 mg total) by mouth at bedtime as needed. -     COMPLETE METABOLIC PANEL WITH GFR  Medication management -     COMPLETE METABOLIC PANEL WITH GFR   Doing well. PHQ/GAD stable.  Refilled medications.  cmp for medication management.  Follow up in 6 months.

## 2020-09-21 IMAGING — MG DIGITAL SCREENING BILAT W/ TOMO W/ CAD
8 series · 8 of 24 positions shown · non-contrast
Comparison: Previous exam(s).

CLINICAL DATA: Screening.

EXAM:
DIGITAL SCREENING BILATERAL MAMMOGRAM WITH TOMO AND CAD

[R MLO synth-2D]
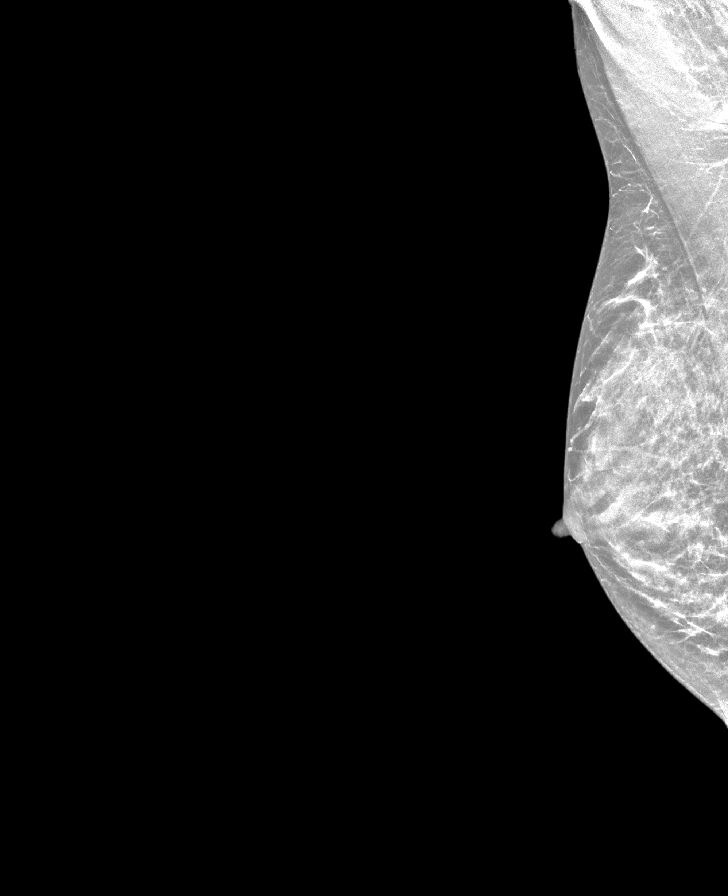

[L CC synth-2D]
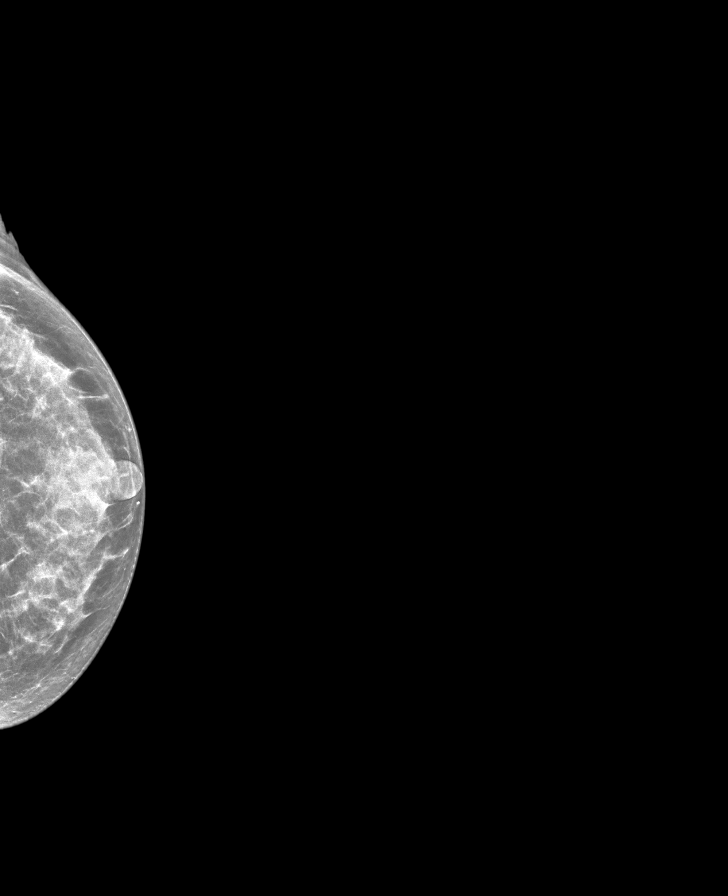

[R CC synth-2D]
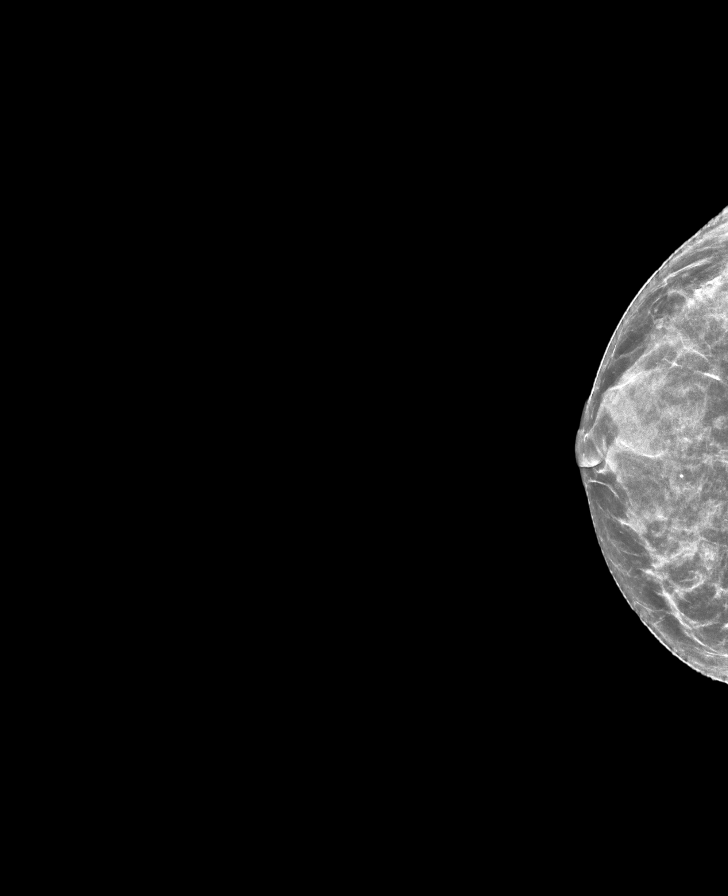

[L MLO synth-2D]
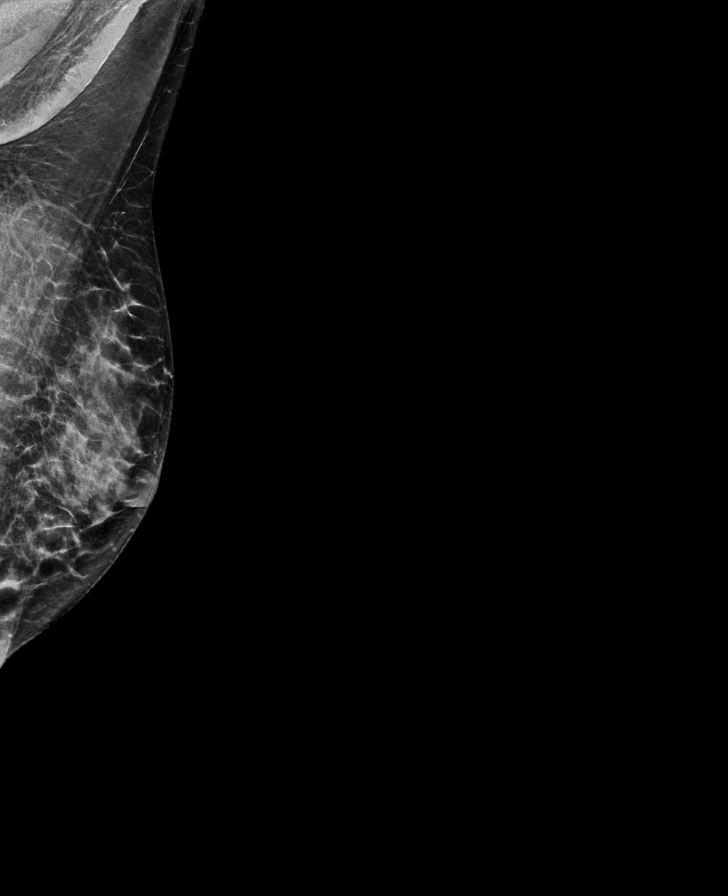

[L CC tomo · tomo slice 27/53.0]
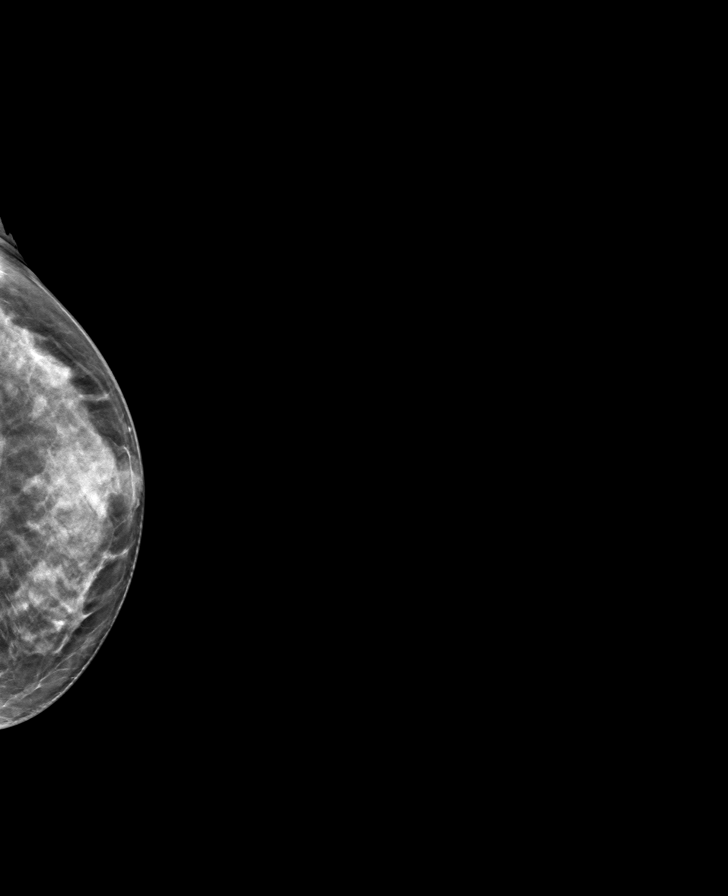

[R MLO tomo · tomo slice 23/45.0]
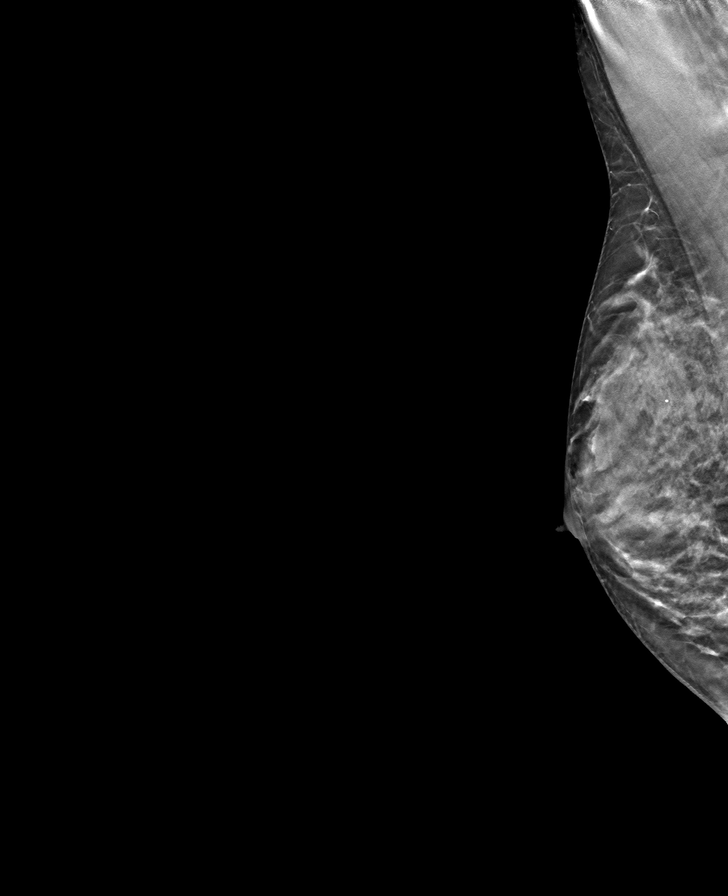

[R CC tomo · tomo slice 25/50.0]
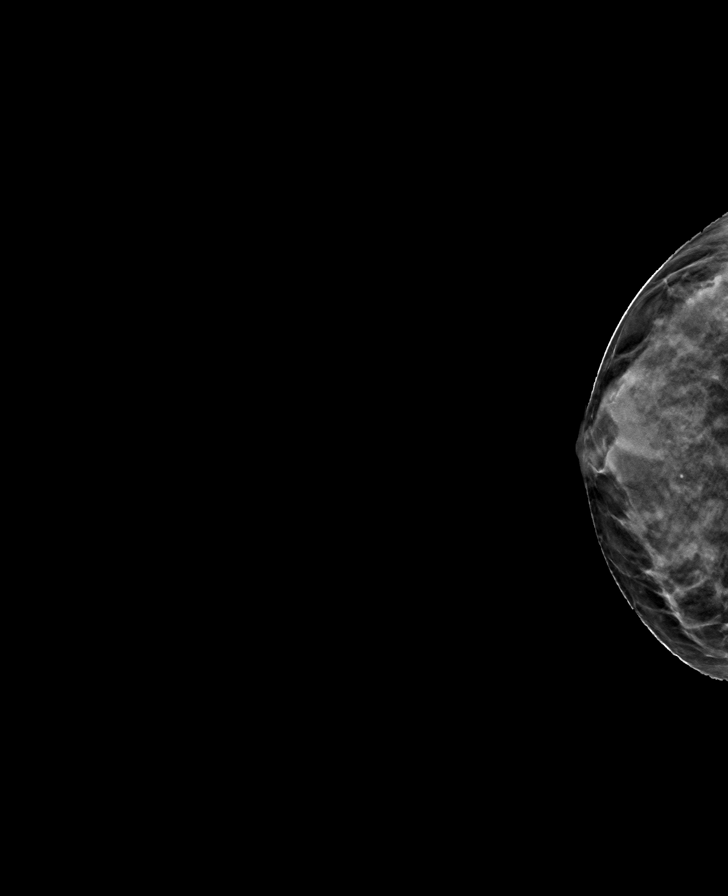

[L MLO tomo · tomo slice 27/54.0]
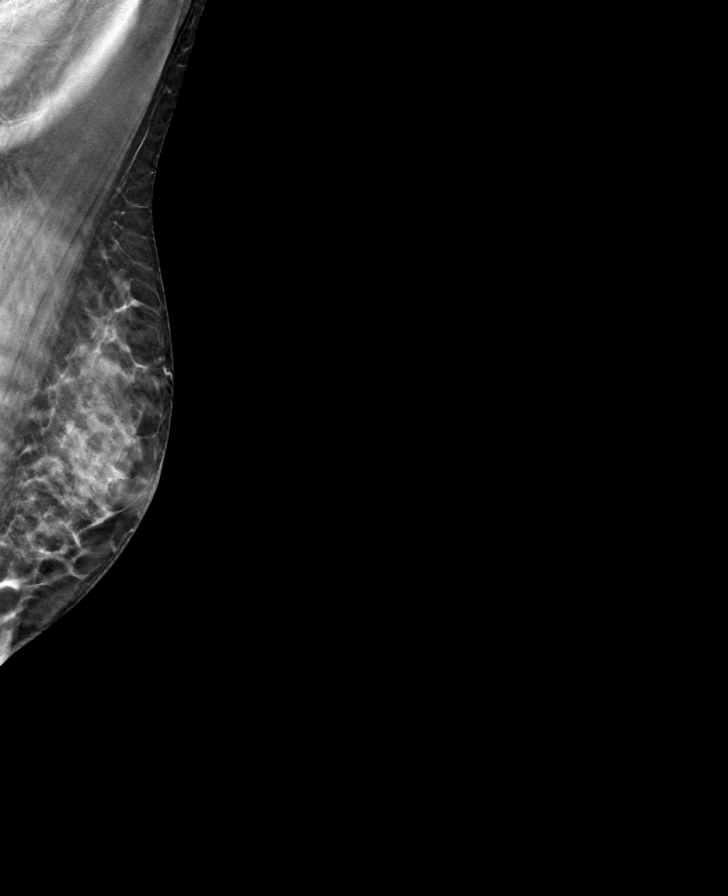

[8 of 24 positions shown; findings below may reference images not displayed]

ACR Breast Density Category d: The breast tissue is extremely dense,
which lowers the sensitivity of mammography
FINDINGS: There are no findings suspicious for malignancy. Images were
processed with CAD.
IMPRESSION: No mammographic evidence of malignancy. A result letter of this
screening mammogram will be mailed directly to the patient.

RECOMMENDATION:
Screening mammogram in one year. (Code:WO-[id number])

BI-RADS CATEGORY  1: Negative.

## 2020-10-10 ENCOUNTER — Telehealth: Payer: Self-pay | Admitting: Neurology

## 2020-10-10 NOTE — Telephone Encounter (Signed)
Prior Authorization for Citalopram submitted via covermymeds. Awaiting response.  Drug Citalopram Hydrobromide 20MG  tablets Form [employer] Prior Authorization Request Form Prior Authorization Request for [employer] [phone]

## 2020-10-14 NOTE — Telephone Encounter (Signed)
PA approval for citalopram until 10/10/2021. [place] has been updated.

## 2021-02-14 ENCOUNTER — Other Ambulatory Visit: Payer: Self-pay

## 2021-02-14 ENCOUNTER — Encounter: Payer: Self-pay | Admitting: Physician Assistant

## 2021-02-14 ENCOUNTER — Ambulatory Visit (INDEPENDENT_AMBULATORY_CARE_PROVIDER_SITE_OTHER): Payer: 59 | Admitting: Physician Assistant

## 2021-02-14 VITALS — BP 101/61 | HR 80 | Ht 68.0 in | Wt 138.0 lb

## 2021-02-14 DIAGNOSIS — F410 Panic disorder [episodic paroxysmal anxiety] without agoraphobia: Secondary | ICD-10-CM

## 2021-02-14 DIAGNOSIS — F411 Generalized anxiety disorder: Secondary | ICD-10-CM | POA: Diagnosis not present

## 2021-02-14 DIAGNOSIS — Z79899 Other long term (current) drug therapy: Secondary | ICD-10-CM

## 2021-02-14 DIAGNOSIS — F5101 Primary insomnia: Secondary | ICD-10-CM

## 2021-02-14 DIAGNOSIS — Z13220 Encounter for screening for lipoid disorders: Secondary | ICD-10-CM

## 2021-02-14 DIAGNOSIS — F3340 Major depressive disorder, recurrent, in remission, unspecified: Secondary | ICD-10-CM | POA: Diagnosis not present

## 2021-02-14 DIAGNOSIS — Z1329 Encounter for screening for other suspected endocrine disorder: Secondary | ICD-10-CM

## 2021-02-14 DIAGNOSIS — Z131 Encounter for screening for diabetes mellitus: Secondary | ICD-10-CM

## 2021-02-14 DIAGNOSIS — J301 Allergic rhinitis due to pollen: Secondary | ICD-10-CM

## 2021-02-14 MED ORDER — LORAZEPAM 0.5 MG PO TABS: 0.5000 mg | ORAL_TABLET | Freq: Three times a day (TID) | ORAL | 1 refills | Status: DC | PRN

## 2021-02-14 MED ORDER — MIRTAZAPINE 15 MG PO TABS: 15.0000 mg | ORAL_TABLET | Freq: Every day | ORAL | 0 refills | Status: DC

## 2021-02-14 MED ORDER — CITALOPRAM HYDROBROMIDE 20 MG PO TABS: 20.0000 mg | ORAL_TABLET | Freq: Every day | ORAL | 1 refills | Status: DC

## 2021-02-15 NOTE — Progress Notes (Signed)
Subjective:    Patient ID: Betty Stuart, female    DOB: 18-Feb-1961, 60 y.o.   MRN: [id number]  HPI  Patient is a 60 year old female with insomnia, GAD, MDD, panic attacks who presents to the clinic for follow-up.  Patient has been on weight after for insomnia but does not seem to be working very well.  She still has to take Ativan some nights to sleep.  She is also added back and melatonin.  She feels like her day time anxiety is well controlled. No SI/HC.   Marland Kitchen. Active Ambulatory Problems    Diagnosis Date Noted  . Panic attack 12/01/2014  . GAD (generalized anxiety disorder) 12/01/2014  . Depression 12/01/2014  . 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 Insomnia 12/01/2014  . Uterine fibroid 12/01/2014  . Lumbar herniated disc 06/01/2015  . History of cervical dysplasia 11/29/2015  . 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 Toenail fungus 03/13/2017   Resolved Ambulatory Problems    Diagnosis Date Noted  . No Resolved Ambulatory Problems   No Additional Past Medical History       Review of Systems  All other systems reviewed and are negative.      Objective:   Physical Exam Vitals reviewed.  Constitutional:      Appearance: Normal appearance.  HENT:     Head: Normocephalic.  Cardiovascular:     Rate and Rhythm: Normal rate and regular rhythm.     Pulses: Normal pulses.  Pulmonary:     Effort: Pulmonary effort is normal.     Breath sounds: Normal breath sounds.  Neurological:     General: No focal deficit present.     Mental Status: She is alert and oriented to person, place, and time.  Psychiatric:        Mood and Affect: Mood normal.    .. Depression screen [facility] 2/9 02/14/2021 08/17/2020 06/27/2020 06/13/2020 02/15/2020  Decreased Interest 0 0 1 0 0  Down, Depressed, Hopeless 1 1 1 1 1   PHQ - 2 Score 1 1 2 1 1   Altered sleeping 2 2 1 1 3   Tired, decreased energy 0 0 1 0 1  Change in appetite 0 0 0 0 0  Feeling bad or failure about yourself  1 0 1 0 0  Trouble  concentrating 0 0 1 0 0  Moving slowly or fidgety/restless 0 0 0 0 0  Suicidal thoughts 0 0 0 0 0  PHQ-9 Score 4 3 6 2 5   Difficult doing work/chores Not difficult at all Not difficult at all Somewhat difficult Not difficult at all Not difficult at all  Some recent data might be hidden   .Marland Kitchen GAD 7 : Generalized Anxiety Score 02/14/2021 08/17/2020 06/27/2020 06/13/2020  Nervous, Anxious, on Edge 0 1 2 1   Control/stop worrying 1 1 2  0  Worry too much - different things 1 1 2 1   Trouble relaxing 0 0 2 0  Restless 0 0 1 0  Easily annoyed or irritable 1 1 1  0  Afraid - awful might happen 0 0 0 0  Total GAD 7 Score 3 4 10 2   Anxiety Difficulty Not difficult at all Not difficult at all Somewhat difficult Not difficult at all           Assessment & Plan:  Marland KitchenMarland KitchenDawn was seen today for follow-up.  Diagnoses and all orders for this visit:  Primary insomnia -     mirtazapine (REMERON) 15 MG tablet; Take 1 tablet (15 mg  total) by mouth at bedtime.  GAD (generalized anxiety disorder) -     citalopram (CELEXA) 20 MG tablet; Take 1 tablet (20 mg total) by mouth daily.  Recurrent major depressive disorder, in remission (HCC) -     citalopram (CELEXA) 20 MG tablet; Take 1 tablet (20 mg total) by mouth daily.  Panic attack -     LORazepam (ATIVAN) 0.5 MG tablet; Take 1 tablet (0.5 mg total) by mouth every 8 (eight) hours as needed for anxiety.  Medication management -     CBC with Differential/Platelet -     COMPLETE METABOLIC PANEL WITH GFR -     Lipid Panel w/reflex Direct LDL -     TSH  Lipid screening -     Lipid Panel w/reflex Direct LDL  Diabetes mellitus screening -     COMPLETE METABOLIC PANEL WITH GFR  Thyroid disorder screen -     TSH  Seasonal allergic rhinitis due to pollen   Pt continues to have trouble going to sleep even with lunesta. Discouraged use of ativan at bedtime. Will try remeron. Discussed good bedtime routine.   PHQ/GAD stable refilled celexa. Refilled  ativan for as needed use.   Needs screening labs.

## 2021-02-19 ENCOUNTER — Encounter: Payer: Self-pay | Admitting: Physician Assistant

## 2021-02-19 DIAGNOSIS — F5101 Primary insomnia: Secondary | ICD-10-CM

## 2021-02-20 MED ORDER — ESZOPICLONE 3 MG PO TABS
3.0000 mg | ORAL_TABLET | Freq: Every evening | ORAL | 1 refills | Status: DC | PRN
Start: 2021-02-20 — End: 2021-08-18

## 2021-02-22 ENCOUNTER — Encounter: Payer: Self-pay | Admitting: Physician Assistant

## 2021-02-22 DIAGNOSIS — R7989 Other specified abnormal findings of blood chemistry: Secondary | ICD-10-CM | POA: Insufficient documentation

## 2021-02-22 NOTE — Progress Notes (Signed)
Betty Stuart,   Cholesterol looks fantastic.  Kidney, liver, glucose look amazing.  Normal hemoglobin.   Your TSH is very low this represents "HYPER" too much thyroid hormone circulating. Too much thyroid hormone circulating can cause palpitations, anxiety, insomnia, irritability, hair loss, weight loss, sweating and others.   We are going to add on Free t4 and T3 as well as thyroglobin and thyroxidase(TPO) antibody testing to confirm and get a little more information.

## 2021-02-23 LAB — CBC WITH DIFFERENTIAL/PLATELET
Absolute Monocytes: 570 cells/uL (ref 200–950)
Basophils Absolute: 60 cells/uL (ref 0–200)
Basophils Relative: 1.3 %
Eosinophils Absolute: 212 cells/uL (ref 15–500)
Eosinophils Relative: 4.6 %
HCT: 40.9 % (ref 35.0–45.0)
Hemoglobin: 13.3 g/dL (ref 11.7–15.5)
Lymphs Abs: 1366 cells/uL (ref 850–3900)
MCH: 28.2 pg (ref 27.0–33.0)
MCHC: 32.5 g/dL (ref 32.0–36.0)
MCV: 86.8 fL (ref 80.0–100.0)
MPV: 10.1 fL (ref 7.5–12.5)
Monocytes Relative: 12.4 %
Neutro Abs: 2392 cells/uL (ref 1500–7800)
Neutrophils Relative %: 52 %
Platelets: 285 10*3/uL (ref 140–400)
RBC: 4.71 10*6/uL (ref 3.80–5.10)
RDW: 12 % (ref 11.0–15.0)
Total Lymphocyte: 29.7 %
WBC: 4.6 10*3/uL (ref 3.8–10.8)

## 2021-02-23 LAB — THYROID ANTIBODIES
Thyroglobulin Ab: <1 IU/mL (ref <=1)
Thyroperoxidase Ab SerPl-aCnc: 1 IU/mL (ref <9)

## 2021-02-23 LAB — COMPLETE METABOLIC PANEL WITH GFR
AG Ratio: 1.9 (calc) (ref 1.0–2.5)
ALT: 20 U/L (ref 6–29)
AST: 19 U/L (ref 10–35)
Albumin: 4.2 g/dL (ref 3.6–5.1)
Alkaline phosphatase (APISO): 49 U/L (ref 37–153)
BUN: 14 mg/dL (ref 7–25)
CO2: 29 mmol/L (ref 20–32)
Calcium: 9.5 mg/dL (ref 8.6–10.4)
Chloride: 106 mmol/L (ref 98–110)
Creat: 0.87 mg/dL (ref 0.50–1.05)
GFR, Est African American: 86 mL/min/{1.73_m2} (ref >=60)
GFR, Est Non African American: 74 mL/min/{1.73_m2} (ref >=60)
Globulin: 2.2 g/dL (calc) (ref 1.9–3.7)
Glucose, Bld: 85 mg/dL (ref 65–99)
Potassium: 4.2 mmol/L (ref 3.5–5.3)
Sodium: 142 mmol/L (ref 135–146)
Total Bilirubin: 0.5 mg/dL (ref 0.2–1.2)
Total Protein: 6.4 g/dL (ref 6.1–8.1)

## 2021-02-23 LAB — T3, FREE: T3, Free: 4.1 pg/mL (ref 2.3–4.2)

## 2021-02-23 LAB — LIPID PANEL W/REFLEX DIRECT LDL
Cholesterol: 149 mg/dL (ref <200)
HDL: 78 mg/dL (ref >=50)
LDL Cholesterol (Calc): 57 mg/dL (calc)
Non-HDL Cholesterol (Calc): 71 mg/dL (calc) (ref <130)
Total CHOL/HDL Ratio: 1.9 (calc) (ref <5.0)
Triglycerides: 68 mg/dL (ref <150)

## 2021-02-23 LAB — T4, FREE: Free T4: 1.7 ng/dL (ref 0.8–1.8)

## 2021-02-23 LAB — TSH: TSH: 0.02 mIU/L — ABNORMAL LOW (ref 0.40–4.50)

## 2021-02-23 NOTE — Progress Notes (Signed)
Your free thyroid levels are upper limits of normal but not out of the normal range. Recheck in 6 months.

## 2021-02-24 ENCOUNTER — Other Ambulatory Visit: Payer: Self-pay | Admitting: Neurology

## 2021-02-24 MED ORDER — CITALOPRAM HYDROBROMIDE 40 MG PO TABS: 40.0000 mg | ORAL_TABLET | Freq: Every day | ORAL | 1 refills | Status: DC

## 2021-02-24 NOTE — Progress Notes (Signed)
She may increase celexa to 40mg . We can start a medication methimazole that blocks some of the thyroid activity and recheck in 6 weeks to get you more in the middle thyroid range. If you just want to try the increase in celexa first then we could recheck thyroid in 3 months.

## 2021-03-07 ENCOUNTER — Telehealth: Payer: Self-pay

## 2021-03-07 NOTE — Telephone Encounter (Signed)
PA submitted for Celexa 40 MG, awaiting response.

## 2021-03-08 ENCOUNTER — Telehealth: Payer: Self-pay | Admitting: *Deleted

## 2021-03-08 NOTE — Telephone Encounter (Signed)
Opened in error

## 2021-03-08 NOTE — Telephone Encounter (Signed)
Citalopram approved until 03/07/22. Pharmacy notified.

## 2021-05-17 ENCOUNTER — Encounter: Payer: Self-pay | Admitting: Physician Assistant

## 2021-05-17 DIAGNOSIS — E0590 Thyrotoxicosis, unspecified without thyrotoxic crisis or storm: Secondary | ICD-10-CM

## 2021-05-22 LAB — T4, FREE: Free T4: 1.7 ng/dL (ref 0.8–1.8)

## 2021-05-22 LAB — TSH: TSH: 0.01 mIU/L — ABNORMAL LOW (ref 0.40–4.50)

## 2021-05-22 LAB — T3, FREE: T3, Free: 3.8 pg/mL (ref 2.3–4.2)

## 2021-05-22 NOTE — Progress Notes (Signed)
JJ, please add T4 and T3. TSH showing a bit more suppression. I think starting methimazole and endocrinology referral is the next best step. Marland Kitchen

## 2021-05-23 ENCOUNTER — Other Ambulatory Visit: Payer: Self-pay | Admitting: Neurology

## 2021-05-23 DIAGNOSIS — E0590 Thyrotoxicosis, unspecified without thyrotoxic crisis or storm: Secondary | ICD-10-CM

## 2021-05-26 MED ORDER — METHIMAZOLE 5 MG PO TABS: 5.0000 mg | ORAL_TABLET | Freq: Every day | ORAL | 1 refills | Status: DC

## 2021-07-04 ENCOUNTER — Ambulatory Visit (INDEPENDENT_AMBULATORY_CARE_PROVIDER_SITE_OTHER): Payer: 59 | Admitting: Internal Medicine

## 2021-07-04 ENCOUNTER — Encounter: Payer: Self-pay | Admitting: Internal Medicine

## 2021-07-04 ENCOUNTER — Other Ambulatory Visit: Payer: Self-pay

## 2021-07-04 VITALS — BP 108/70 | HR 76 | Ht 68.0 in | Wt 140.8 lb

## 2021-07-04 DIAGNOSIS — E0590 Thyrotoxicosis, unspecified without thyrotoxic crisis or storm: Secondary | ICD-10-CM | POA: Diagnosis not present

## 2021-07-04 LAB — TSH: TSH: 1.41 u[IU]/mL (ref 0.35–5.50)

## 2021-07-04 LAB — T4, FREE: Free T4: 0.78 ng/dL (ref 0.60–1.60)

## 2021-07-04 NOTE — Progress Notes (Signed)
Name: Betty Stuart  MRN/ DOB: [id number], 1961-05-24    Age/ Sex: 60 y.o., female    PCP: Donella Stade, PA-C   Reason for Endocrinology Evaluation: Subclinical Hyperthyroidism     Date of Initial Endocrinology Evaluation: 07/04/2021     HPI: [name] is a 60 y.o. female with a past medical history of GAD. The patient presented for initial endocrinology clinic visit on 07/04/2021 for consultative assistance with her Subclinical Hyperthyroidism.   She has been noted with suppressed TSH since 01/2021 with a nadir of 0.01 uIU/mL in 05/2021 with normal free T4 and T3.   She has noted weight loss at the time  Has chronic tremors  Has chronic depression, anxiety and insomnia which was worsening  Brittle nails and abdominal rash  Has had palpitations    Currently on Methimazole 5 mg , started  in 05/2021  Denies vomiting or abdominal pain  Denies  local neck swelling   Methimazole has improved hand tremors    Anti-TPO 1    NO FH of thyroid disease    HISTORY:  Past Medical History: No past medical history on file. Past Surgical History:  Past Surgical History:  Procedure Laterality Date   BUNIONECTOMY     CESAREAN SECTION     herniated disk      Social History:  reports that she has never smoked. She has never used smokeless tobacco. She reports current alcohol use of about 1.0 standard drink per week. She reports that she does not use drugs. Family History: family history includes Alcohol abuse in her maternal uncle and paternal uncle.   HOME MEDICATIONS: Allergies as of 07/04/2021       Reactions   Hydrocodone Nausea And Vomiting   Remeron [mirtazapine]    Not effective.    Tramadol Nausea And Vomiting   Zoloft [sertraline Hcl] Rash        Medication List        Accurate as of July 04, 2021 10:32 AM. If you have any questions, ask your nurse or doctor.          STOP taking these medications    UNABLE TO FIND Stopped by: Dorita Sciara, MD       TAKE these medications    citalopram 40 MG tablet Commonly known as: CELEXA Take 1 tablet (40 mg total) by mouth daily.   Eszopiclone 3 MG Tabs Take 1 tablet (3 mg total) by mouth at bedtime as needed.   fluticasone 50 MCG/ACT nasal spray Commonly known as: FLONASE Place into both nostrils daily.   ibuprofen 100 MG tablet Commonly known as: ADVIL Take 100 mg by mouth as needed for fever.   LORazepam 0.5 MG tablet Commonly known as: ATIVAN Take 1 tablet (0.5 mg total) by mouth every 8 (eight) hours as needed for anxiety.   methimazole 5 MG tablet Commonly known as: TAPAZOLE Take 1 tablet (5 mg total) by mouth daily.   TYLENOL 8 HOUR PO Take by mouth as needed.   valACYclovir 500 MG tablet Commonly known as: VALTREX TAKE 1 CAPLET BY MOUTH ONCE DAILY          REVIEW OF SYSTEMS: A comprehensive ROS was conducted with the patient and is negative except as per HPI     OBJECTIVE:  VS: BP 108/70 (BP Location: Left Arm, Patient Position: Sitting, Cuff Size: Small)   Pulse 76   Ht 5\' 8"  (1.727 m)   Wt 140 lb 12.8  oz (63.9 kg)   LMP 06/07/2014   SpO2 98%   BMI 21.41 kg/m    Wt Readings from Last 3 Encounters:  07/04/21 140 lb 12.8 oz (63.9 kg)  02/14/21 138 lb (62.6 kg)  08/17/20 139 lb (63 kg)     EXAM: General: Pt appears well and is in NAD  Eyes: External eye exam normal without stare, lid lag or exophthalmos.  EOM intact.  PERRL.  Neck: General: Supple without adenopathy. Thyroid: Thyroid size normal.  No goiter or nodules appreciated. No thyroid bruit.  Lungs: Clear with good BS bilat with no rales, rhonchi, or wheezes  Heart: Auscultation: RRR.  Abdomen: Normoactive bowel sounds, soft, nontender, without masses or organomegaly palpable  Extremities:  BL LE: No pretibial edema normal ROM and strength.  Mental Status: Judgment, insight: Intact Orientation: Oriented to time, place, and person Mood and affect: No depression,  anxiety, or agitation     DATA REVIEWED:   Results for AERIKA, GROLL (MRN [id number]) as of 07/05/2021 14:12  Ref. Range 07/04/2021 10:42  TSH Latest Ref Range: 0.35 - 5.50 uIU/mL 1.41  T4,Free(Direct) Latest Ref Range: 0.60 - 1.60 ng/dL 0.78    ASSESSMENT/PLAN/RECOMMENDATIONS:   Subclinical Hyperthyroidism:   - Pt is clinically euthyroid  - No local neck symptoms  -The causes of subclinical hyperthyroidism are the same as the causes of overt hyperthyroidism, and like overt hyperthyroidism, subclinical hyperthyroidism can be persistent or transient. Common causes of subclinical hyperthyroidism include autonomously functioning thyroid adenomas and multinodular goiters , or Graves' disease    - Most patients with subclinical hyperthyroidism have no clinical manifestations of hyperthyroidism, and those symptoms that are present (eg, tachycardia, tremor, dyspnea on exertion, weight loss) are mild and nonspecific." However, subclinical hyperthyroidism is associated with an increased risk of atrial fibrillation and, primarily in postmenopausal women, a decrease in bone mineral density.  - Will check TRAb and thyroid ultrasound  - She is tolerating methimazole without side effects    Medications : Continue methimazole 5 mg daily    F/U in 4 months   Signed electronically by: Mack Guise, MD  [facility] Endocrinology  [facility] [address] [place], [place] [address] Phone: [phone] FAX: [phone]   CC: [address] [place] [address] Phone: [phone] Fax: [phone]   Return to Endocrinology clinic as below: Future Appointments  Date Time Provider [place]  08/18/2021  8:10 AM Donella Stade, PA-C [facility] None

## 2021-07-05 MED ORDER — METHIMAZOLE 5 MG PO TABS: 5.0000 mg | ORAL_TABLET | Freq: Every day | ORAL | 1 refills | Status: DC

## 2021-07-06 LAB — TRAB (TSH RECEPTOR BINDING ANTIBODY): TRAB: <1 IU/L (ref <=2.00)

## 2021-07-10 ENCOUNTER — Encounter: Payer: Self-pay | Admitting: Internal Medicine

## 2021-07-13 ENCOUNTER — Ambulatory Visit (HOSPITAL_BASED_OUTPATIENT_CLINIC_OR_DEPARTMENT_OTHER)
Admission: RE | Admit: 2021-07-13 | Discharge: 2021-07-13 | Disposition: A | Discharge status: Home / Self Care | Payer: 59 | Source: Ambulatory Visit | Attending: Internal Medicine | Admitting: Internal Medicine

## 2021-07-13 ENCOUNTER — Other Ambulatory Visit: Payer: Self-pay

## 2021-07-13 DIAGNOSIS — E0590 Thyrotoxicosis, unspecified without thyrotoxic crisis or storm: Secondary | ICD-10-CM | POA: Insufficient documentation

## 2021-07-15 ENCOUNTER — Encounter: Payer: Self-pay | Admitting: Internal Medicine

## 2021-07-17 ENCOUNTER — Other Ambulatory Visit: Payer: Self-pay

## 2021-08-18 ENCOUNTER — Ambulatory Visit (INDEPENDENT_AMBULATORY_CARE_PROVIDER_SITE_OTHER): Payer: 59 | Admitting: Physician Assistant

## 2021-08-18 ENCOUNTER — Other Ambulatory Visit: Payer: Self-pay

## 2021-08-18 ENCOUNTER — Encounter: Payer: Self-pay | Admitting: Physician Assistant

## 2021-08-18 VITALS — BP 115/69 | HR 79 | Temp 99.0°F | Wt 146.0 lb

## 2021-08-18 DIAGNOSIS — F411 Generalized anxiety disorder: Secondary | ICD-10-CM | POA: Diagnosis not present

## 2021-08-18 DIAGNOSIS — F5101 Primary insomnia: Secondary | ICD-10-CM

## 2021-08-18 DIAGNOSIS — F410 Panic disorder [episodic paroxysmal anxiety] without agoraphobia: Secondary | ICD-10-CM | POA: Diagnosis not present

## 2021-08-18 DIAGNOSIS — F3340 Major depressive disorder, recurrent, in remission, unspecified: Secondary | ICD-10-CM | POA: Diagnosis not present

## 2021-08-18 MED ORDER — ESZOPICLONE 3 MG PO TABS: 3.0000 mg | ORAL_TABLET | Freq: Every evening | ORAL | 1 refills | Status: DC | PRN

## 2021-08-18 MED ORDER — LORAZEPAM 0.5 MG PO TABS: 0.5000 mg | ORAL_TABLET | Freq: Three times a day (TID) | ORAL | 1 refills | Status: DC | PRN

## 2021-08-18 MED ORDER — DOXEPIN HCL 10 MG PO CAPS: 10.0000 mg | ORAL_CAPSULE | Freq: Every day | ORAL | 0 refills | Status: DC

## 2021-08-18 NOTE — Progress Notes (Signed)
   Subjective:    Patient ID: Betty Stuart, female    DOB: 1960-10-09, 60 y.o.   MRN: [id number]  HPI Pt is a 60 yo female with insomnia, anxiety, MDD who presents to the clinic for medication refills.   Insomnia is doing ok with lunesta. Best option she has tried. Still some nights lunesta and melatonin and ativan to get to sleep. Mood is ok. Her husband has cancer and she is having to take care of him. She is also planning on getting a divorce. She is trying to take it day by day.   .. 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     Objective:   Physical Exam Vitals reviewed.  Constitutional:      Appearance: Normal appearance.  HENT:     Head: Normocephalic.  Cardiovascular:     Rate and Rhythm: Normal rate and regular rhythm.     Pulses: Normal pulses.  Pulmonary:     Effort: Pulmonary effort is normal.     Breath sounds: Normal breath sounds.  Neurological:     General: No focal deficit present.     Mental Status: She is alert and oriented to person, place, and time.  Psychiatric:        Mood and Affect: Mood normal.          Assessment & Plan:  Marland KitchenMarland KitchenDawn was seen today for medication refill.  Diagnoses and all orders for this visit:  GAD (generalized anxiety disorder) -     LORazepam (ATIVAN) 0.5 MG tablet; Take 1 tablet (0.5 mg total) by mouth every 8 (eight) hours as needed for anxiety.  Panic attack -      LORazepam (ATIVAN) 0.5 MG tablet; Take 1 tablet (0.5 mg total) by mouth every 8 (eight) hours as needed for anxiety.  Primary insomnia -     Eszopiclone 3 MG TABS; Take 1 tablet (3 mg total) by mouth at bedtime as needed. -     doxepin (SINEQUAN) 10 MG capsule; Take 1 capsule (10 mg total) by mouth at bedtime.  Recurrent major depressive disorder, in remission ([place])  Lunesta refilled. Not working great. Trial of doxepin sent for trial.  Follow up in 1 month.  Continue on celexa.  Needs pap. Please make appt.

## 2021-08-18 NOTE — Progress Notes (Signed)
L 

## 2021-08-21 ENCOUNTER — Encounter: Payer: Self-pay | Admitting: Physician Assistant

## 2021-09-03 ENCOUNTER — Encounter: Payer: Self-pay | Admitting: Physician Assistant

## 2021-09-05 ENCOUNTER — Ambulatory Visit (INDEPENDENT_AMBULATORY_CARE_PROVIDER_SITE_OTHER): Payer: 59 | Admitting: Physician Assistant

## 2021-09-05 ENCOUNTER — Other Ambulatory Visit: Payer: Self-pay

## 2021-09-05 ENCOUNTER — Encounter: Payer: Self-pay | Admitting: Physician Assistant

## 2021-09-05 VITALS — BP 118/53 | HR 98 | Temp 99.3°F | Ht 68.0 in | Wt 141.0 lb

## 2021-09-05 DIAGNOSIS — R8290 Unspecified abnormal findings in urine: Secondary | ICD-10-CM | POA: Diagnosis not present

## 2021-09-05 DIAGNOSIS — R110 Nausea: Secondary | ICD-10-CM | POA: Diagnosis not present

## 2021-09-05 DIAGNOSIS — M545 Low back pain, unspecified: Secondary | ICD-10-CM | POA: Diagnosis not present

## 2021-09-05 DIAGNOSIS — N12 Tubulo-interstitial nephritis, not specified as acute or chronic: Secondary | ICD-10-CM

## 2021-09-05 LAB — POCT URINALYSIS DIP (CLINITEK)
Bilirubin, UA: NEGATIVE
Glucose, UA: NEGATIVE mg/dL
Ketones, POC UA: NEGATIVE mg/dL
Nitrite, UA: NEGATIVE
POC PROTEIN,UA: >=300 — AB
Spec Grav, UA: 1.02 (ref 1.010–1.025)
Urobilinogen, UA: 1 E.U./dL
pH, UA: 8.5 — AB (ref 5.0–8.0)

## 2021-09-05 MED ORDER — CEFTRIAXONE SODIUM 1 G IJ SOLR
1.0000 g | Freq: Once | INTRAMUSCULAR | Status: AC
  Administered 2021-09-05: 1 g via INTRAMUSCULAR

## 2021-09-05 MED ORDER — CIPROFLOXACIN HCL 500 MG PO TABS: 500.0000 mg | ORAL_TABLET | Freq: Two times a day (BID) | ORAL | 0 refills | Status: DC

## 2021-09-05 MED ORDER — ONDANSETRON 8 MG PO TBDP: 8.0000 mg | ORAL_TABLET | Freq: Three times a day (TID) | ORAL | 3 refills | Status: DC | PRN

## 2021-09-05 NOTE — Progress Notes (Signed)
   Subjective:    Patient ID: Betty Stuart, female    DOB: January 16, 1961, 60 y.o.   MRN: [id number]  Dysuria  Associated symptoms include chills, flank pain, frequency, nausea, urgency and vomiting.  60 y.o female presenting with one week of urinary urgency, frequency, and subprapubic pain/fullness. Patient states that Azo has not provided much relief. Pt began experiencing lower back pain yesterday that has since progressed to include a low-grade fever, chills, body aches, nausea and a bout vomiting that lasted about 20 minutes. Pt denies any  gross hematuria or bowel changes.    Review of Systems  Constitutional:  Positive for chills, fatigue and fever.  Respiratory:  Negative for shortness of breath.   Cardiovascular:  Negative for chest pain.  Gastrointestinal:  Positive for nausea and vomiting. Negative for abdominal distention, abdominal pain, constipation and diarrhea.  Genitourinary:  Positive for dysuria, flank pain, frequency, pelvic pain and urgency.  Musculoskeletal:  Positive for back pain and myalgias.  Skin:  Positive for pallor.  Neurological:  Negative for dizziness and headaches.      Objective:   Physical Exam Constitutional:      Appearance: She is ill-appearing.  Cardiovascular:     Rate and Rhythm: Normal rate and regular rhythm.     Pulses: Normal pulses.  Abdominal:     General: There is no distension.     Tenderness: There is no abdominal tenderness. There is right CVA tenderness and left CVA tenderness.  Skin:    Coloration: Skin is pale.  Neurological:     Mental Status: She is oriented to person, place, and time.   Pt appeared pale, hunched over, and had chills upon PE. Pt did not report any tenderness to palpation over the subprapubic or abdominal regions. Significant tenderness noted over the right and left costovertebral angle.         Assessment & Plan:  Diagnoses and all orders for this visit: Pyelonephritis of right kidney -      ciprofloxacin (CIPRO) 500 MG tablet; Take 1 tablet (500 mg total) by mouth 2 (two) times daily for 10 days. -     ondansetron (ZOFRAN-ODT) 8 MG disintegrating tablet; Take 1 tablet (8 mg total) by mouth every 8 (eight) hours as needed for nausea. -     cefTRIAXone (ROCEPHIN) injection 1 g Abnormal urine odor -     POCT URINALYSIS DIP (CLINITEK) -     Urine Culture -     cefTRIAXone (ROCEPHIN) injection 1 g Acute bilateral low back pain without sciatica -     POCT URINALYSIS DIP (CLINITEK) -     Urine Culture -     cefTRIAXone (ROCEPHIN) injection 1 g Nausea -     ondansetron (ZOFRAN-ODT) 8 MG disintegrating tablet; Take 1 tablet (8 mg total) by mouth every 8 (eight) hours as needed for nausea.   Most likely pyelonephritis due to week of UTI symptoms proceeded by fever, vomiting, and CVA tenderness.  -1g Rocephin IM given today.  -Pt to begin Ciprofloxacin for 10 days  -Zofran provided for relief of nausea and vomiting  -Educated pt on importance of staying hydrated -Pt to follow-up within 48 hours if she is not experiencing significant symptomatic improvement

## 2021-09-05 NOTE — Patient Instructions (Signed)
Pyelonephritis, Adult Pyelonephritis is an infection that occurs in the kidney. The kidneys are the organs that filter a person's blood and move waste out of the bloodstream and into the urine. Urine passes from the kidneys, through tubes called ureters, and into the bladder. There are two main types of pyelonephritis: Infections that come on quickly without any warning (acute pyelonephritis). Infections that last for a long period of time (chronic pyelonephritis). In most cases, the infection clears up with treatment and does not cause further problems. More severe infections or chronic infections can sometimes spread to the bloodstream or lead to other problems with the kidneys. What are the causes? This condition is usually caused by: Bacteria traveling from the bladder up to the kidney. This may occur after having a bladder infection (cystitis) or urinary tract infection (UTI). Bladder infections caused from bacteria traveling from the bloodstream to the kidney. What increases the risk? This condition is more likely to develop in: Pregnant women. Older people. People who have any of these conditions: Diabetes. Inflammation of the prostate gland (prostatitis), in males. Kidney stones or bladder stones. Other abnormalities of the kidney or ureter. Cancer. People who have a catheter placed in the bladder. People who are sexually active. Women who use spermicides. People who have had a prior UTI. What are the signs or symptoms? Symptoms of this condition include: Frequent urination. Strong or persistent urge to urinate. Burning or stinging when urinating. Abdominal pain. Back pain. Pain in the side or flank area. Fever or chills. Blood in the urine, or dark urine. Nausea or vomiting. How is this diagnosed? This condition may be diagnosed based on: Your medical history and a physical exam. Urine tests. Blood tests. You may also have imaging tests of the kidneys, such as an  ultrasound or CT scan. How is this treated? Treatment for this condition may depend on the severity of the infection. If the infection is mild and is found early, you may be treated with antibiotic medicines taken by mouth (orally). You will need to drink fluids to remain hydrated. If the infection is more severe, you may need to stay in the hospital and receive antibiotics given directly into a vein through an IV. You may also need to receive fluids through an IV if you are not able to remain hydrated. After your hospital stay, you may need to take oral antibiotics for a period of time. Other treatments may be required, depending on the cause of the infection. Follow these instructions at home: Medicines Take your antibiotic medicine as told by your health care provider. Do not stop taking the antibiotic even if you start to feel better. Take over-the-counter and prescription medicines only as told by your health care provider. General instructions  Drink enough fluid to keep your urine pale yellow. Avoid caffeine, tea, and carbonated beverages. They tend to irritate the bladder. Urinate often. Avoid holding in urine for long periods of time. Urinate before and after sex. After a bowel movement, women should cleanse from front to back. Use each tissue only once. Keep all follow-up visits as told by your health care provider. This is important. Contact a health care provider if: Your symptoms do not get better after 2 days of treatment. Your symptoms get worse. You have a fever. Get help right away if you: Are unable to take your antibiotics or fluids. Have shaking chills. Vomit. Have severe flank or back pain. Have extreme weakness or fainting. Summary Pyelonephritis is a urinary tract infection (  UTI) that occurs in the kidney. Treatment for this condition may depend on the severity of the infection. Take your antibiotic medicine as told by your health care provider. Do not stop  taking the antibiotic even if you start to feel better. Drink enough fluid to keep your urine pale yellow. Keep all follow-up visits as told by your health care provider. This is important. This information is not intended to replace advice given to you by your health care provider. Make sure you discuss any questions you have with your health care provider. Document Revised: 04/27/2021 Document Reviewed: 04/27/2021 Elsevier Patient Education  [place].

## 2021-09-08 LAB — URINE CULTURE
MICRO NUMBER:: 12723823
SPECIMEN QUALITY:: ADEQUATE

## 2021-09-08 NOTE — Progress Notes (Signed)
E.coli detected and sensitive to cipro which was given should be feeling much better!

## 2021-09-15 ENCOUNTER — Ambulatory Visit: Payer: 59 | Admitting: Physician Assistant

## 2021-09-18 ENCOUNTER — Encounter: Payer: Self-pay | Admitting: Physician Assistant

## 2021-09-18 ENCOUNTER — Other Ambulatory Visit (HOSPITAL_COMMUNITY)
Admission: RE | Admit: 2021-09-18 | Discharge: 2021-09-18 | Disposition: A | Discharge status: Home / Self Care | Payer: 59 | Source: Ambulatory Visit | Attending: Physician Assistant | Admitting: Physician Assistant

## 2021-09-18 ENCOUNTER — Ambulatory Visit (INDEPENDENT_AMBULATORY_CARE_PROVIDER_SITE_OTHER): Payer: 59 | Admitting: Physician Assistant

## 2021-09-18 ENCOUNTER — Other Ambulatory Visit: Payer: Self-pay

## 2021-09-18 VITALS — BP 119/64 | HR 92 | Ht 68.0 in | Wt 141.0 lb

## 2021-09-18 DIAGNOSIS — Z124 Encounter for screening for malignant neoplasm of cervix: Secondary | ICD-10-CM

## 2021-09-18 NOTE — Progress Notes (Signed)
° °  Subjective:    Patient ID: Betty Stuart, female    DOB: 06/30/1961, 60 y.o.   MRN: [id number]  HPI Pt prevents to the clinic for pap. No problems or concerns. She has not been sexually active in 11 years. She denies any past pain with intercourse.   .. 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     Objective:   Physical Exam  No abdominal tenderness Painful advancement of speculum and bi manuel exam Friable cervix      Assessment & Plan:  Marland KitchenMarland KitchenDawn was seen today for follow-up.  Diagnoses and all orders for this visit:  Papanicolaou smear -     Cytology - PAP   Pap done today.  Will call with results.  Was a painful exam. Discussed some vaginal estrogen if pap is normal.

## 2021-09-22 LAB — CYTOLOGY - PAP
Comment: NEGATIVE
Diagnosis: NEGATIVE
High risk HPV: NEGATIVE

## 2021-09-22 NOTE — Progress Notes (Signed)
Normal pap, normal cells, no HPV. Follow up in 5 years. Think about some vaginal estrogen cream.

## 2021-11-06 NOTE — Progress Notes (Signed)
Name: Betty Stuart  MRN/ DOB: [id number], [birth year]    Age/ Sex: 61 y.o., female    PCP: Donella Stade, PA-C   Reason for Endocrinology Evaluation: Subclinical Hyperthyroidism     Date of Initial Endocrinology Evaluation: 07/04/2021    HPI: Betty Stuart is a 61 y.o. female with a past medical history of GAD. The patient presented for initial endocrinology clinic visit on 07/04/2021 for consultative assistance with her Subclinical Hyperthyroidism.   She has been noted with suppressed TSH since 01/2021 with a nadir of 0.01 uIU/mL in 05/2021 with normal free T4 and T3.   She has noted weight loss at the time  Has chronic tremors  Has chronic depression, anxiety and insomnia which was worsening  Brittle nails and abdominal rash  Has had palpitations    On her initial visit to our clinic she was on methimazole 5 mg , started  in 05/2021  Methimazole has improved hand tremors    NO FH of thyroid disease      SUBJECTIVE:    Today (11/07/21):  Teara Stuart is here for follow-up on hyperthyroidism.  Weight has been stable  Denies abdominal pain or vomiting  Denies vomiting Rare palpitations  Denies local neck swelling    Methimazole 5 mg daily   HISTORY:  Past Medical History: No past medical history on file. Past Surgical History:  Past Surgical History:  Procedure Laterality Date   BUNIONECTOMY     CESAREAN SECTION     herniated disk      Social History:  reports that she has never smoked. She has never used smokeless tobacco. She reports current alcohol use of about 1.0 standard drink per week. She reports that she does not use drugs. Family History: family history includes Alcohol abuse in her maternal uncle and paternal uncle.   HOME MEDICATIONS: Allergies as of 11/07/2021       Reactions   Hydrocodone Nausea And Vomiting   Remeron [mirtazapine]    Not effective.    Tramadol Nausea And Vomiting   Zoloft [sertraline Hcl] Rash         Medication List        Accurate as of November 07, 2021 12:38 PM. If you have any questions, ask your nurse or doctor.          STOP taking these medications    ondansetron 8 MG disintegrating tablet Commonly known as: ZOFRAN-ODT Stopped by: Dorita Sciara, MD       TAKE these medications    citalopram 40 MG tablet Commonly known as: CELEXA Take 1 tablet (40 mg total) by mouth daily.   Eszopiclone 3 MG Tabs Take 1 tablet (3 mg total) by mouth at bedtime as needed.   fluticasone 50 MCG/ACT nasal spray Commonly known as: FLONASE Place into both nostrils daily.   ibuprofen 100 MG tablet Commonly known as: ADVIL Take 100 mg by mouth as needed for fever.   LORazepam 0.5 MG tablet Commonly known as: ATIVAN Take 1 tablet (0.5 mg total) by mouth every 8 (eight) hours as needed for anxiety.   methimazole 5 MG tablet Commonly known as: TAPAZOLE Take 1 tablet (5 mg total) by mouth daily.   TYLENOL 8 HOUR PO Take by mouth as needed.   valACYclovir 500 MG tablet Commonly known as: VALTREX TAKE 1 CAPLET BY MOUTH ONCE DAILY          REVIEW OF SYSTEMS: A comprehensive ROS was conducted with the patient and  is negative except as per HPI     OBJECTIVE:  VS: BP 110/68 (BP Location: Left Arm, Patient Position: Sitting, Cuff Size: Small)    Pulse 76    Ht 5\' 8"  (1.727 m)    Wt 140 lb 12.8 oz (63.9 kg)    LMP 06/07/2014    SpO2 98%    BMI 21.41 kg/m    Wt Readings from Last 3 Encounters:  11/07/21 140 lb 12.8 oz (63.9 kg)  09/18/21 141 lb (64 kg)  09/05/21 141 lb (64 kg)     EXAM: General: Pt appears well and is in NAD  Eyes: External eye exam normal without stare, lid lag or exophthalmos.  EOM intact.  PERRL.  Neck: General: Supple without adenopathy. Thyroid: Thyroid size normal.  No goiter or nodules appreciated. No thyroid bruit.  Lungs: Clear with good BS bilat with no rales, rhonchi, or wheezes  Heart: Auscultation: RRR.  Abdomen: Normoactive  bowel sounds, soft, nontender, without masses or organomegaly palpable  Extremities:  BL LE: No pretibial edema normal ROM and strength.  Mental Status: Judgment, insight: Intact Orientation: Oriented to time, place, and person Mood and affect: No depression, anxiety, or agitation     DATA REVIEWED:   Latest Reference Range & Units 11/07/21 08:05  Sodium 135 - 145 mEq/L 140  Potassium 3.5 - 5.1 mEq/L 4.6  Chloride 96 - 112 mEq/L 102  CO2 19 - 32 mEq/L 34 (H)  Glucose 70 - 99 mg/dL 78  BUN 6 - 23 mg/dL 17  Creatinine 0.40 - 1.20 mg/dL 1.02  Calcium 8.4 - 10.5 mg/dL 9.7  Alkaline Phosphatase 39 - 117 U/L 61  Albumin 3.5 - 5.2 g/dL 4.2  AST 0 - 37 U/L 17  ALT 0 - 35 U/L 19  Total Protein 6.0 - 8.3 g/dL 6.5  Total Bilirubin 0.2 - 1.2 mg/dL 0.6  GFR >60.00 mL/min 60.63    Latest Reference Range & Units 11/07/21 08:05  TSH 0.35 - 5.50 uIU/mL 3.55  T4,Free(Direct) 0.60 - 1.60 ng/dL 0.78     Latest Reference Range & Units 07/04/21 10:42  TRAB <=2.00 IU/L <1.00    Thyroid ultrasound 07/13/2021  Estimated total number of nodules >/= 1 cm: 0   Number of spongiform nodules >/=  2 cm not described below (TR1): 0   Number of mixed cystic and solid nodules >/= 1.5 cm not described below (TR2): 0   _________________________________________________________   Minor thyroid heterogeneity. There are a few scattered subcentimeter benign cystic nodules noted, all measuring 5 mm or less in size. These would not meet criteria for any biopsy or follow-up. No other significant thyroid abnormality. No hypervascularity. No regional adenopathy.   IMPRESSION: Nonspecific minor thyroid heterogeneity and subcentimeter benign cystic nodules.     ASSESSMENT/PLAN/RECOMMENDATIONS:   Subclinical Hyperthyroidism:   - Pt is clinically euthyroid  - No local neck symptoms  -TSH is trending up, will reduce the dose as below -TRAb was undetectable, and her thyroid ultrasound showed  subcentimeter nodules.    Medications : Decrease methimazole 5 mg , half a tablet daily    F/U in 4 months   Signed electronically by: Mack Guise, MD  [facility] Endocrinology  [facility] [address] [place], [place] [address] Phone: [phone] FAX: [phone]   CC: [address] [place] [address] Phone: [phone] Fax: [phone]   Return to Endocrinology clinic as below: Future Appointments  Date Time Provider [place]  03/06/2022  7:30 AM Samin Milke, Melanie Crazier, MD [id number] None

## 2021-11-07 ENCOUNTER — Encounter: Payer: Self-pay | Admitting: Internal Medicine

## 2021-11-07 ENCOUNTER — Ambulatory Visit (INDEPENDENT_AMBULATORY_CARE_PROVIDER_SITE_OTHER): Payer: 59 | Admitting: Internal Medicine

## 2021-11-07 VITALS — BP 110/68 | HR 76 | Ht 68.0 in | Wt 140.8 lb

## 2021-11-07 DIAGNOSIS — E0590 Thyrotoxicosis, unspecified without thyrotoxic crisis or storm: Secondary | ICD-10-CM | POA: Diagnosis not present

## 2021-11-07 LAB — CBC WITH DIFFERENTIAL/PLATELET
Basophils Absolute: 0 10*3/uL (ref 0.0–0.1)
Basophils Relative: 0.9 % (ref 0.0–3.0)
Eosinophils Absolute: 0.2 10*3/uL (ref 0.0–0.7)
Eosinophils Relative: 3 % (ref 0.0–5.0)
HCT: 40.8 % (ref 36.0–46.0)
Hemoglobin: 13.3 g/dL (ref 12.0–15.0)
Lymphocytes Relative: 24.9 % (ref 12.0–46.0)
Lymphs Abs: 1.3 10*3/uL (ref 0.7–4.0)
MCHC: 32.7 g/dL (ref 30.0–36.0)
MCV: 89.2 fl (ref 78.0–100.0)
Monocytes Absolute: 0.6 10*3/uL (ref 0.1–1.0)
Monocytes Relative: 11 % (ref 3.0–12.0)
Neutro Abs: 3.1 10*3/uL (ref 1.4–7.7)
Neutrophils Relative %: 60.2 % (ref 43.0–77.0)
Platelets: 284 10*3/uL (ref 150.0–400.0)
RBC: 4.58 Mil/uL (ref 3.87–5.11)
RDW: 14.2 % (ref 11.5–15.5)
WBC: 5.1 10*3/uL (ref 4.0–10.5)

## 2021-11-07 LAB — COMPREHENSIVE METABOLIC PANEL
ALT: 19 U/L (ref 0–35)
AST: 17 U/L (ref 0–37)
Albumin: 4.2 g/dL (ref 3.5–5.2)
Alkaline Phosphatase: 61 U/L (ref 39–117)
BUN: 17 mg/dL (ref 6–23)
CO2: 34 mEq/L — ABNORMAL HIGH (ref 19–32)
Calcium: 9.7 mg/dL (ref 8.4–10.5)
Chloride: 102 mEq/L (ref 96–112)
Creatinine, Ser: 1.02 mg/dL (ref 0.40–1.20)
GFR: 60.63 mL/min (ref >60.00)
Glucose, Bld: 78 mg/dL (ref 70–99)
Potassium: 4.6 mEq/L (ref 3.5–5.1)
Sodium: 140 mEq/L (ref 135–145)
Total Bilirubin: 0.6 mg/dL (ref 0.2–1.2)
Total Protein: 6.5 g/dL (ref 6.0–8.3)

## 2021-11-07 LAB — T4, FREE: Free T4: 0.78 ng/dL (ref 0.60–1.60)

## 2021-11-07 LAB — TSH: TSH: 3.55 u[IU]/mL (ref 0.35–5.50)

## 2021-11-08 ENCOUNTER — Encounter: Payer: Self-pay | Admitting: Internal Medicine

## 2021-11-08 MED ORDER — METHIMAZOLE 5 MG PO TABS: 2.5000 mg | ORAL_TABLET | Freq: Every day | ORAL | 2 refills | Status: DC

## 2022-01-01 ENCOUNTER — Ambulatory Visit (INDEPENDENT_AMBULATORY_CARE_PROVIDER_SITE_OTHER): Payer: Commercial Managed Care - PPO | Admitting: Physician Assistant

## 2022-01-01 VITALS — BP 121/75 | HR 72 | Ht 68.0 in | Wt 143.0 lb

## 2022-01-01 DIAGNOSIS — Z6379 Other stressful life events affecting family and household: Secondary | ICD-10-CM | POA: Insufficient documentation

## 2022-01-01 DIAGNOSIS — F410 Panic disorder [episodic paroxysmal anxiety] without agoraphobia: Secondary | ICD-10-CM

## 2022-01-01 DIAGNOSIS — F5101 Primary insomnia: Secondary | ICD-10-CM | POA: Diagnosis not present

## 2022-01-01 DIAGNOSIS — F411 Generalized anxiety disorder: Secondary | ICD-10-CM | POA: Diagnosis not present

## 2022-01-01 DIAGNOSIS — F3340 Major depressive disorder, recurrent, in remission, unspecified: Secondary | ICD-10-CM

## 2022-01-01 DIAGNOSIS — B0050 Herpesviral ocular disease, unspecified: Secondary | ICD-10-CM

## 2022-01-01 MED ORDER — ESZOPICLONE 3 MG PO TABS: 3.0000 mg | ORAL_TABLET | Freq: Every evening | ORAL | 1 refills | Status: DC | PRN

## 2022-01-01 MED ORDER — LORAZEPAM 0.5 MG PO TABS: 0.5000 mg | ORAL_TABLET | Freq: Three times a day (TID) | ORAL | 1 refills | Status: DC | PRN

## 2022-01-01 MED ORDER — CITALOPRAM HYDROBROMIDE 40 MG PO TABS: 40.0000 mg | ORAL_TABLET | Freq: Every day | ORAL | 1 refills | Status: DC

## 2022-01-01 MED ORDER — BUSPIRONE HCL 10 MG PO TABS: 10.0000 mg | ORAL_TABLET | Freq: Two times a day (BID) | ORAL | 5 refills | Status: DC

## 2022-01-01 NOTE — Progress Notes (Signed)
? ?Subjective:  ? ? Patient ID: Betty Stuart, female    DOB: 1961-07-29, 61 y.o.   MRN: [id number] ? ?HPI ?Betty Stuart is a 61 yo female presenting to the clinic today for a 3 month medication follow up.  ? ?She states to be doing okay at this time considering her life stressors of her husband having colorectal cancer along with her current thyroid issues. She is using '3mg'$  of eszopiclone, 1-1.5 tab of Ativan, and Melatonin nightly to sleep. She is also taking Celexa daily and is open to adjunct treatment for her anxiety. Her anxiety continues to be problematic. Denies any SI/HC.  ? ?.. ?Active Ambulatory Problems  ?  Diagnosis Date Noted  ? Panic attack 12/01/2014  ? GAD (generalized anxiety disorder) 12/01/2014  ? Depression 12/01/2014  ? 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 Insomnia 12/01/2014  ? Uterine fibroid 12/01/2014  ? Lumbar herniated disc 06/01/2015  ? History of cervical dysplasia 11/29/2015  ? 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 Toenail fungus 03/13/2017  ? Decreased thyroid stimulating hormone (TSH) level 02/22/2021  ? Stressful life event affecting family 01/01/2022  ? Recurrent major depressive disorder, in remission ([place]) 01/01/2022  ? ?Resolved Ambulatory Problems  ?  Diagnosis Date Noted  ? No Resolved Ambulatory Problems  ? ?No Additional Past Medical History  ? ? ? ?Review of Systems  ?Psychiatric/Behavioral:  Positive for sleep disturbance. Negative for agitation and confusion. The patient is nervous/anxious.   ? ?   ?Objective:  ? Physical Exam ?Constitutional:   ?   Appearance: Normal appearance.  ?Cardiovascular:  ?   Rate and Rhythm: Normal rate and regular rhythm.  ?Neurological:  ?   Mental Status: She is alert.  ?Psychiatric:     ?   Mood and Affect: Mood normal.     ?   Behavior: Behavior normal.     ?   Thought Content: Thought content normal.     ?   Judgment: Judgment normal.  ? ?.. ? ?  01/01/2022  ?  3:20 PM 02/14/2021  ?  9:07 AM 08/17/2020  ?  8:11 AM 06/27/2020  ?  2:24 PM  ?GAD 7  : Generalized Anxiety Score  ?Nervous, Anxious, on Edge 1 0 1 2  ?Control/stop worrying '2 1 1 2  '$ ?Worry too much - different things '1 1 1 2  '$ ?Trouble relaxing 2 0 0 2  ?Restless 1 0 0 1  ?Easily annoyed or irritable '1 1 1 1  '$ ?Afraid - awful might happen 0 0 0 0  ?Total GAD 7 Score '8 3 4 10  '$ ?Anxiety Difficulty Not difficult at all Not difficult at all Not difficult at all Somewhat difficult  ? ? ?.. ? ?  01/01/2022  ?  3:18 PM 08/18/2021  ?  8:21 AM 02/14/2021  ?  9:06 AM 08/17/2020  ?  8:11 AM 06/27/2020  ?  2:24 PM  ?Depression screen PHQ 2/9  ?Decreased Interest 1 1 0 0 1  ?Down, Depressed, Hopeless '1 1 1 1 1  '$ ?PHQ - 2 Score '2 2 1 1 2  '$ ?Altered sleeping '3  2 2 1  '$ ?Tired, decreased energy 1  0 0 1  ?Change in appetite 1  0 0 0  ?Feeling bad or failure about yourself  0  1 0 1  ?Trouble concentrating 1  0 0 1  ?Moving slowly or fidgety/restless 0  0 0 0  ?Suicidal thoughts 0  0 0 0  ?PHQ-9  Score '8  4 3 6  '$ ?Difficult doing work/chores Not difficult at all  Not difficult at all Not difficult at all Somewhat difficult  ? ? ?   ?Assessment & Plan:  ?..Betty Stuart was seen today for follow-up. ? ?Diagnoses and all orders for this visit: ? ?Primary insomnia ?-     Eszopiclone 3 MG TABS; Take 1 tablet (3 mg total) by mouth at bedtime as needed. ? ?Panic attack ?-     LORazepam (ATIVAN) 0.5 MG tablet; Take 1 tablet (0.5 mg total) by mouth every 8 (eight) hours as needed for anxiety. ?-     busPIRone (BUSPAR) 10 MG tablet; Take 1 tablet (10 mg total) by mouth 2 (two) times daily. ? ?GAD (generalized anxiety disorder) ?-     LORazepam (ATIVAN) 0.5 MG tablet; Take 1 tablet (0.5 mg total) by mouth every 8 (eight) hours as needed for anxiety. ?-     citalopram (CELEXA) 40 MG tablet; Take 1 tablet (40 mg total) by mouth daily. ?-     busPIRone (BUSPAR) 10 MG tablet; Take 1 tablet (10 mg total) by mouth 2 (two) times daily. ? ?Stressful life event affecting family ?-     busPIRone (BUSPAR) 10 MG tablet; Take 1 tablet (10 mg total) by mouth  2 (two) times daily. ? ?Recurrent major depressive disorder, in remission ([place]) ?-     citalopram (CELEXA) 40 MG tablet; Take 1 tablet (40 mg total) by mouth daily. ? ?Herpes simplex of eye ? ?GAD-7 is 15 today indicating severe anxiety. Patient is on maximum dose of Celexa, and is agreeable to adjunct therapy. Starting 10 mg of Buspar today in efforts to help decrease anxiety and help with sleep management. Discussed behavioral self calming mechanisms. Will see back in 3 mos to assess current treatment regimen. ?

## 2022-01-02 ENCOUNTER — Encounter: Payer: Self-pay | Admitting: Physician Assistant

## 2022-01-17 ENCOUNTER — Telehealth: Payer: Self-pay

## 2022-01-17 NOTE — Telephone Encounter (Addendum)
Initiated Prior authorization TNZ:DKEUVHAWU HCI 10 MG ?Via: [email or web address] ?JNWM/GEE:[id number] ?Status: approved as of 01/17/22 ?Reason: ?Notified Pt via: Mychart ? ?Initiated Prior authorization ZLY:TSSQSYPZX 0.5MG tablets ?Via: [email or web address] ?Case/Key:[id number] ?Status: denied  as of 01/17/22 ?Reason:[place] not met ?Notified Pt via: Mychart ?

## 2022-01-25 ENCOUNTER — Encounter: Payer: Self-pay | Admitting: Physician Assistant

## 2022-01-25 DIAGNOSIS — Z6379 Other stressful life events affecting family and household: Secondary | ICD-10-CM

## 2022-01-25 DIAGNOSIS — F411 Generalized anxiety disorder: Secondary | ICD-10-CM

## 2022-01-25 DIAGNOSIS — F410 Panic disorder [episodic paroxysmal anxiety] without agoraphobia: Secondary | ICD-10-CM

## 2022-02-16 ENCOUNTER — Telehealth: Payer: Self-pay

## 2022-02-16 NOTE — Telephone Encounter (Addendum)
Initiated Prior authorization XOV:ANVBTYOMAY (CELEXA) 40 MG tablet Via: truescripts epa Case/Key:n/a Status: approved  as of 02/16/22 Reason:approved until 03/08/23 Notified Pt via: Mychart

## 2022-02-20 LAB — HM MAMMOGRAPHY

## 2022-02-27 MED ORDER — BUSPIRONE HCL 15 MG PO TABS: 15.0000 mg | ORAL_TABLET | Freq: Two times a day (BID) | ORAL | 1 refills | Status: DC

## 2022-02-27 NOTE — Telephone Encounter (Signed)
New dose sent. Make sure to keep af/u appt  Meds ordered this encounter  Medications   busPIRone (BUSPAR) 15 MG tablet    Sig: Take 1 tablet (15 mg total) by mouth 2 (two) times daily.    Dispense:  60 tablet    Refill:  1

## 2022-03-05 NOTE — Progress Notes (Unsigned)
Name: Betty Stuart  MRN/ DOB: [id number], 1961-08-27    Age/ Sex: 61 y.o., female    PCP: Donella Stade, PA-C   Reason for Endocrinology Evaluation: Subclinical Hyperthyroidism     Date of Initial Endocrinology Evaluation: 07/04/2021    HPI: [name] is a 61 y.o. female with a past medical history of GAD. The patient presented for initial endocrinology clinic visit on 07/04/2021 for consultative assistance with her Subclinical Hyperthyroidism.   She has been noted with suppressed TSH since 01/2021 with a nadir of 0.01 uIU/mL in 05/2021 with normal free T4 and T3.   She has noted weight loss at the time  Has chronic tremors  Has chronic depression, anxiety and insomnia which was worsening  Brittle nails and abdominal rash  Has had palpitations    On her initial visit to our clinic she was on methimazole 5 mg , started  in 05/2021  Methimazole has improved hand tremors    NO FH of thyroid disease   TRAB undetectable  Thyroid ultrasound 07/2021 showed sub-centimeter nodules      SUBJECTIVE:    Today (03/06/22):  Betty Stuart is here for follow-up on hyperthyroidism.  Weight has been stable  Tremors stable unless stressful Denies constipation or diarrhea  Denies local neck swelling    Methimazole 5 mg, half a tablet daily     HISTORY:  Past Medical History: No past medical history on file. Past Surgical History:  Past Surgical History:  Procedure Laterality Date   BUNIONECTOMY     CESAREAN SECTION     herniated disk      Social History:  reports that she has never smoked. She has never used smokeless tobacco. She reports current alcohol use of about 1.0 standard drink per week. She reports that she does not use drugs. Family History: family history includes Alcohol abuse in her maternal uncle and paternal uncle.   HOME MEDICATIONS: Allergies as of 03/06/2022       Reactions   Hydrocodone Nausea And Vomiting   Remeron [mirtazapine]    Not  effective.    Tramadol Nausea And Vomiting   Zoloft [sertraline Hcl] Rash        Medication List        Accurate as of March 06, 2022  7:47 AM. If you have any questions, ask your nurse or doctor.          ALLERGY PO Take by mouth. Antronex - natural allergy   busPIRone 15 MG tablet Commonly known as: BUSPAR Take 1 tablet (15 mg total) by mouth 2 (two) times daily.   citalopram 40 MG tablet Commonly known as: CELEXA Take 1 tablet (40 mg total) by mouth daily.   diphenhydrAMINE 25 MG tablet Commonly known as: BENADRYL Take 25 mg by mouth every 6 (six) hours as needed.   Eszopiclone 3 MG Tabs Take 1 tablet (3 mg total) by mouth at bedtime as needed.   FIBER PO Take by mouth.   fluticasone 50 MCG/ACT nasal spray Commonly known as: FLONASE Place into both nostrils daily.   ibuprofen 100 MG tablet Commonly known as: ADVIL Take 100 mg by mouth as needed for fever.   Lactic Acid 10 % Lotn Apply topically.   LORazepam 0.5 MG tablet Commonly known as: ATIVAN Take 1 tablet (0.5 mg total) by mouth every 8 (eight) hours as needed for anxiety.   methimazole 5 MG tablet Commonly known as: TAPAZOLE Take 0.5 tablets (2.5 mg total) by  mouth daily.   TYLENOL 8 HOUR PO Take by mouth as needed.   valACYclovir 1000 MG tablet Commonly known as: VALTREX Take 1,000 mg by mouth 2 (two) times daily.   Zirgan 0.15 % Gel Generic drug: Ganciclovir SMARTSIG:1 Drop(s) In Eye(s) 5 Times Daily          REVIEW OF SYSTEMS: A comprehensive ROS was conducted with the patient and is negative except as per HPI     OBJECTIVE:  VS: BP 112/66 (BP Location: Left Arm, Patient Position: Sitting, Cuff Size: Small)   Pulse 75   Ht '5\' 8"'$  (1.727 m)   Wt 143 lb 9.6 oz (65.1 kg)   LMP 06/07/2014   SpO2 96%   BMI 21.83 kg/m    Wt Readings from Last 3 Encounters:  03/06/22 143 lb 9.6 oz (65.1 kg)  01/02/22 143 lb (64.9 kg)  11/07/21 140 lb 12.8 oz (63.9 kg)     EXAM: General:  Pt appears well and is in NAD  Eyes: External eye exam normal without stare, lid lag or exophthalmos.  EOM intact.  PERRL.  Neck: General: Supple without adenopathy. Thyroid: Thyroid size normal.  No goiter or nodules appreciated. No thyroid bruit.  Lungs: Clear with good BS bilat with no rales, rhonchi, or wheezes  Heart: Auscultation: RRR.  Abdomen: Normoactive bowel sounds, soft, nontender, without masses or organomegaly palpable  Extremities:  BL LE: No pretibial edema normal ROM and strength.  Mental Status: Judgment, insight: Intact Orientation: Oriented to time, place, and person Mood and affect: No depression, anxiety, or agitation     DATA REVIEWED:       Latest Reference Range & Units 11/07/21 08:05  Sodium 135 - 145 mEq/L 140  Potassium 3.5 - 5.1 mEq/L 4.6  Chloride 96 - 112 mEq/L 102  CO2 19 - 32 mEq/L 34 (H)  Glucose 70 - 99 mg/dL 78  BUN 6 - 23 mg/dL 17  Creatinine 0.40 - 1.20 mg/dL 1.02  Calcium 8.4 - 10.5 mg/dL 9.7  Alkaline Phosphatase 39 - 117 U/L 61  Albumin 3.5 - 5.2 g/dL 4.2  AST 0 - 37 U/L 17  ALT 0 - 35 U/L 19  Total Protein 6.0 - 8.3 g/dL 6.5  Total Bilirubin 0.2 - 1.2 mg/dL 0.6  GFR >60.00 mL/min 60.63     Latest Reference Range & Units 07/04/21 10:42  TRAB <=2.00 IU/L <1.00    Thyroid ultrasound 07/13/2021  Estimated total number of nodules >/= 1 cm: 0   Number of spongiform nodules >/=  2 cm not described below (TR1): 0   Number of mixed cystic and solid nodules >/= 1.5 cm not described below ([place]): 0   _________________________________________________________   Minor thyroid heterogeneity. There are a few scattered subcentimeter benign cystic nodules noted, all measuring 5 mm or less in size. These would not meet criteria for any biopsy or follow-up. No other significant thyroid abnormality. No hypervascularity. No regional adenopathy.   IMPRESSION: Nonspecific minor thyroid heterogeneity and subcentimeter benign cystic  nodules.     ASSESSMENT/PLAN/RECOMMENDATIONS:   Subclinical Hyperthyroidism:   - Pt is clinically euthyroid  - No local neck symptoms  -TRAb was undetectable, and her thyroid ultrasound showed subcentimeter nodules. - TFT's normal today , will continue current dose of methimazole     Medications : Continue methimazole 5 mg , half a tablet daily    F/U in 6 months   Signed electronically by: Mack Guise, MD  [facility]  [facility] [address] [place], [place] [address] Phone: [phone] FAX: [phone]   CC: [address] [place] [address] Phone: [phone] Fax: [phone]   Return to Endocrinology clinic as below: Future Appointments  Date Time Provider [place]  04/04/2022  7:50 AM Alden Hipp, Royetta Car, PA-C [facility] None

## 2022-03-06 ENCOUNTER — Ambulatory Visit (INDEPENDENT_AMBULATORY_CARE_PROVIDER_SITE_OTHER): Payer: No Typology Code available for payment source | Admitting: Internal Medicine

## 2022-03-06 ENCOUNTER — Encounter: Payer: Self-pay | Admitting: Internal Medicine

## 2022-03-06 VITALS — BP 112/66 | HR 75 | Ht 68.0 in | Wt 143.6 lb

## 2022-03-06 DIAGNOSIS — E0590 Thyrotoxicosis, unspecified without thyrotoxic crisis or storm: Secondary | ICD-10-CM

## 2022-03-06 LAB — T4, FREE: Free T4: 0.86 ng/dL (ref 0.60–1.60)

## 2022-03-06 LAB — TSH: TSH: 2.81 u[IU]/mL (ref 0.35–5.50)

## 2022-03-06 MED ORDER — METHIMAZOLE 5 MG PO TABS
2.5000 mg | ORAL_TABLET | Freq: Every day | ORAL | 2 refills | Status: DC
Start: 2022-03-06 — End: 2022-09-06

## 2022-03-07 LAB — T3: T3, Total: 98 ng/dL (ref 76–181)

## 2022-04-04 ENCOUNTER — Ambulatory Visit (INDEPENDENT_AMBULATORY_CARE_PROVIDER_SITE_OTHER): Payer: No Typology Code available for payment source | Admitting: Physician Assistant

## 2022-04-04 ENCOUNTER — Telehealth: Payer: Self-pay

## 2022-04-04 ENCOUNTER — Encounter: Payer: Self-pay | Admitting: Physician Assistant

## 2022-04-04 VITALS — BP 94/53 | HR 86 | Ht 68.0 in | Wt 143.0 lb

## 2022-04-04 DIAGNOSIS — F411 Generalized anxiety disorder: Secondary | ICD-10-CM

## 2022-04-04 DIAGNOSIS — F3340 Major depressive disorder, recurrent, in remission, unspecified: Secondary | ICD-10-CM | POA: Diagnosis not present

## 2022-04-04 DIAGNOSIS — F410 Panic disorder [episodic paroxysmal anxiety] without agoraphobia: Secondary | ICD-10-CM

## 2022-04-04 DIAGNOSIS — F5101 Primary insomnia: Secondary | ICD-10-CM

## 2022-04-04 DIAGNOSIS — Z6379 Other stressful life events affecting family and household: Secondary | ICD-10-CM

## 2022-04-04 MED ORDER — BUSPIRONE HCL 15 MG PO TABS: 15.0000 mg | ORAL_TABLET | Freq: Two times a day (BID) | ORAL | 1 refills | Status: DC

## 2022-04-04 MED ORDER — CITALOPRAM HYDROBROMIDE 40 MG PO TABS: 40.0000 mg | ORAL_TABLET | Freq: Every day | ORAL | 0 refills | Status: DC

## 2022-04-04 MED ORDER — ESZOPICLONE 3 MG PO TABS: 3.0000 mg | ORAL_TABLET | Freq: Every evening | ORAL | 0 refills | Status: DC | PRN

## 2022-04-04 MED ORDER — LORAZEPAM 0.5 MG PO TABS
0.5000 mg | ORAL_TABLET | Freq: Three times a day (TID) | ORAL | 0 refills | Status: DC | PRN
Start: 2022-07-05 — End: 2022-08-13

## 2022-04-04 NOTE — Telephone Encounter (Signed)
Initiated Prior authorization YBT:VDFPBHEBB 0.'5MG'$  tablets Via: truscripts epa Case/Key:n/a Status: Pending as of 04/04/22 Reason:fax decision usually take up to 5 days  Notified Pt via: Mychart

## 2022-04-04 NOTE — Progress Notes (Signed)
Established Patient Office Visit  Subjective   Patient ID: Betty Stuart, female    DOB: [birth year]  Age: 61 y.o. MRN: [id number]  Chief Complaint  Patient presents with   Follow-up    HPI Pt is a 61 yo female with insomnia, GAD, MDD, hyperthyroidism and panic attacks who presents to the clinic for medication refills.   She is doing ok. She moves out and starts separation from husband next month. She is sleeping "ok". Some nights she does have to take melatonin with lunesta. Taking ativan fairly regularly. On celexa and buspar. No SI/HC.    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     Objective:     BP (!) 94/53   Pulse 86   Ht '5\' 8"'$  (1.727 m)   Wt 143 lb (64.9 kg)   LMP 06/07/2014   SpO2 99%   BMI 21.74 kg/m  BP Readings from Last 3 Encounters:  04/04/22 (!) 94/53  03/06/22 112/66  01/02/22 121/75   Wt Readings from Last 3 Encounters:  04/04/22 143 lb (64.9 kg)  03/06/22 143 lb 9.6 oz (65.1 kg)  01/02/22 143 lb (64.9 kg)    ..    04/04/2022    7:50 AM 01/01/2022    3:18 PM 08/18/2021    8:21 AM 02/14/2021    9:06 AM 08/17/2020    8:11 AM  Depression screen PHQ 2/9  Decreased Interest 0 1 1 0 0  Down, Depressed, Hopeless '1 1 1 1 1  '$ PHQ - 2 Score  '1 2 2 1 1  '$ Altered sleeping '2 3  2 2  '$ Tired, decreased energy 1 1  0 0  Change in appetite 0 1  0 0  Feeling bad or failure about yourself  0 0  1 0  Trouble concentrating 1 1  0 0  Moving slowly or fidgety/restless 0 0  0 0  Suicidal thoughts 0 0  0 0  PHQ-9 Score '5 8  4 3  '$ Difficult doing work/chores Not difficult at all Not difficult at all  Not difficult at all Not difficult at all   ..    04/04/2022    7:51 AM 01/01/2022    3:20 PM 02/14/2021    9:07 AM 08/17/2020    8:11 AM  GAD 7 : Generalized Anxiety Score  Nervous, Anxious, on Edge 1 1 0 1  Control/stop worrying '1 2 1 1  '$ Worry too much - different things '1 1 1 1  '$ Trouble relaxing 1 2 0 0  Restless 0 1 0 0  Easily annoyed or irritable 0 '1 1 1  '$ Afraid - awful might happen 0 0 0 0  Total GAD 7 Score '4 8 3 4  '$ Anxiety Difficulty Not difficult at all Not  difficult at all Not difficult at all Not difficult at all      Physical Exam Constitutional:      Appearance: Normal appearance.  HENT:     Head: Normocephalic.  Cardiovascular:     Rate and Rhythm: Normal rate and regular rhythm.     Pulses: Normal pulses.     Heart sounds: Normal heart sounds.  Pulmonary:     Effort: Pulmonary effort is normal.     Breath sounds: Normal breath sounds.  Neurological:     General: No focal deficit present.     Mental Status: She is alert and oriented to person, place, and time.  Psychiatric:        Mood and Affect: Mood normal.        Assessment & Plan:  Marland KitchenMarland KitchenDawn was seen today for follow-up.  Diagnoses and all orders for this visit:  GAD (generalized anxiety disorder) -     citalopram (CELEXA) 40 MG tablet; Take 1 tablet (40 mg total) by mouth daily. -     LORazepam (ATIVAN) 0.5 MG tablet; Take 1 tablet (0.5 mg total) by mouth every 8 (eight) hours as needed for anxiety. -     busPIRone (BUSPAR) 15 MG tablet; Take 1 tablet (15 mg total) by mouth 2 (two) times daily.  Panic attack -     LORazepam (ATIVAN) 0.5 MG tablet;  Take 1 tablet (0.5 mg total) by mouth every 8 (eight) hours as needed for anxiety. -     busPIRone (BUSPAR) 15 MG tablet; Take 1 tablet (15 mg total) by mouth 2 (two) times daily.  Primary insomnia -     Eszopiclone 3 MG TABS; Take 1 tablet (3 mg total) by mouth at bedtime as needed.  Recurrent major depressive disorder, in remission ([facility]) -     citalopram (CELEXA) 40 MG tablet; Take 1 tablet (40 mg total) by mouth daily.  Stressful life event affecting family -     busPIRone (BUSPAR) 15 MG tablet; Take 1 tablet (15 mg total) by mouth 2 (two) times daily.   PHQ and GAD numbers better than have been and close to goal of under 4.  Continue on current medications Encouraged self care and counseling Sent lunesta for insomnia. Try to decrease ativan for as needed only.  Follow up in 6 months.    Iran Planas, PA-C

## 2022-07-25 IMAGING — US US THYROID
1 series · 14 of 25 positions shown · non-contrast
Comparison: None.

CLINICAL DATA: Hyperthyroidism

EXAM:
THYROID ULTRASOUND
TECHNIQUE: Ultrasound examination of the thyroid gland and adjacent soft
tissues was performed.

[Series 1: us thyroid · 14 of 32 slices shown]
[im 1/32]
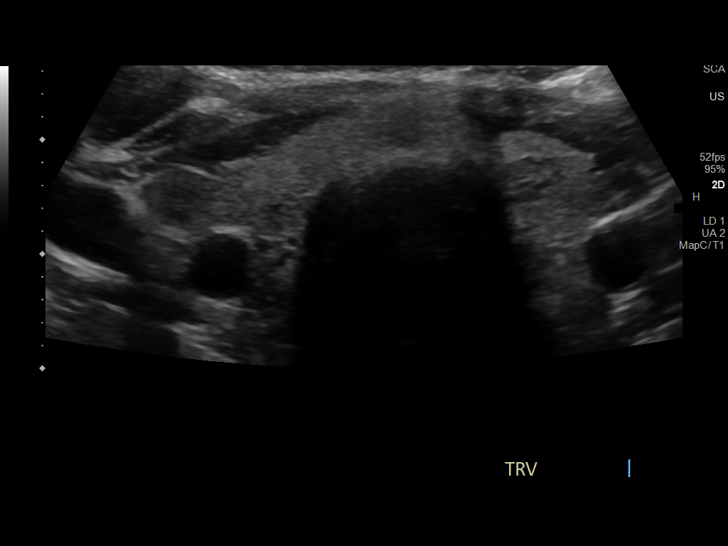
[im 3/32]
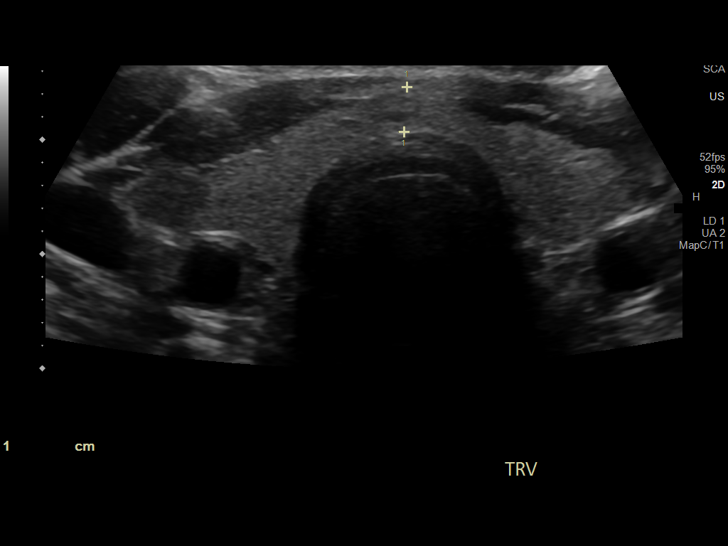
[im 6/32]
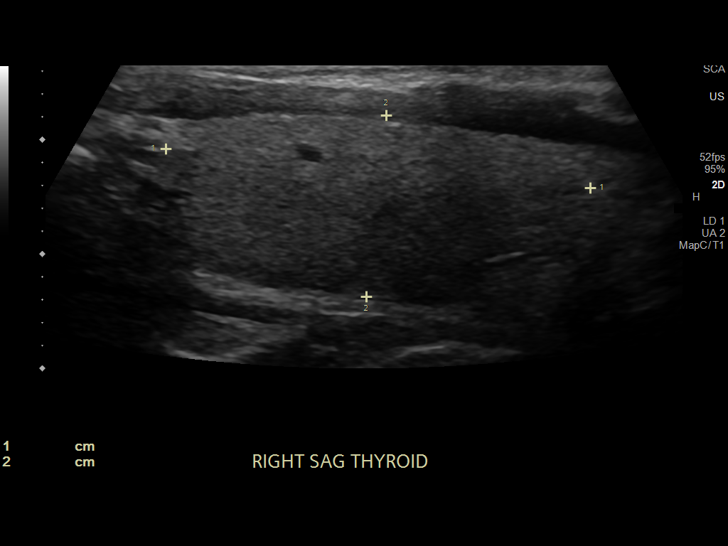
[im 8/32]
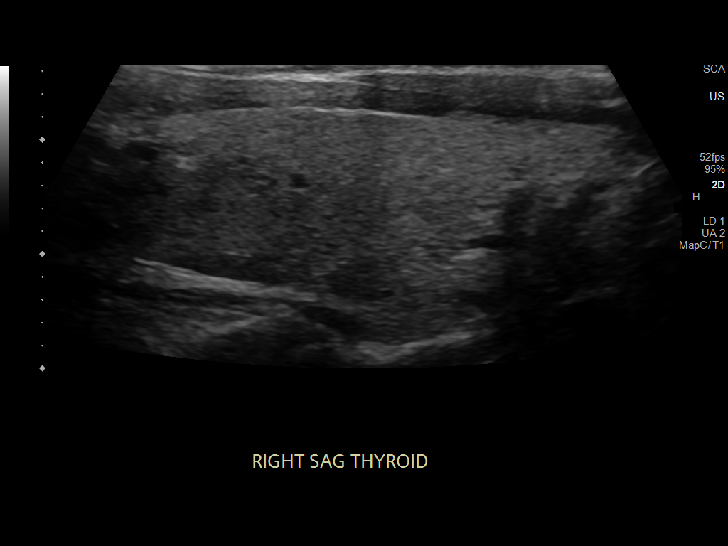
[im 11/32]
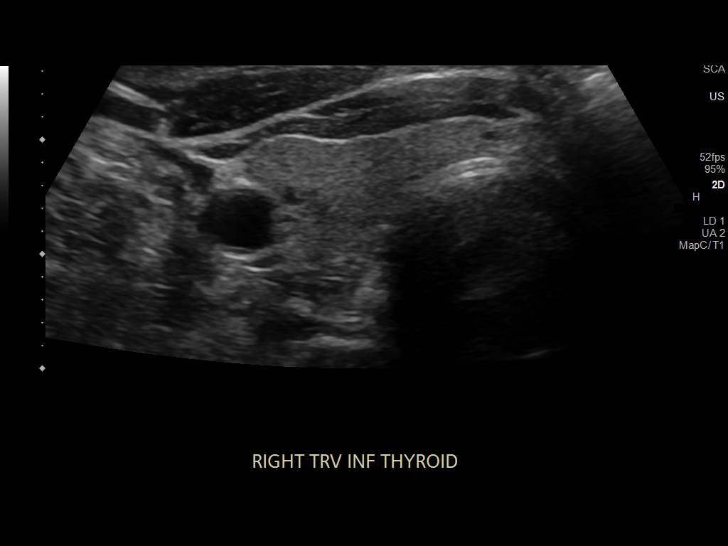
[im 12/32]
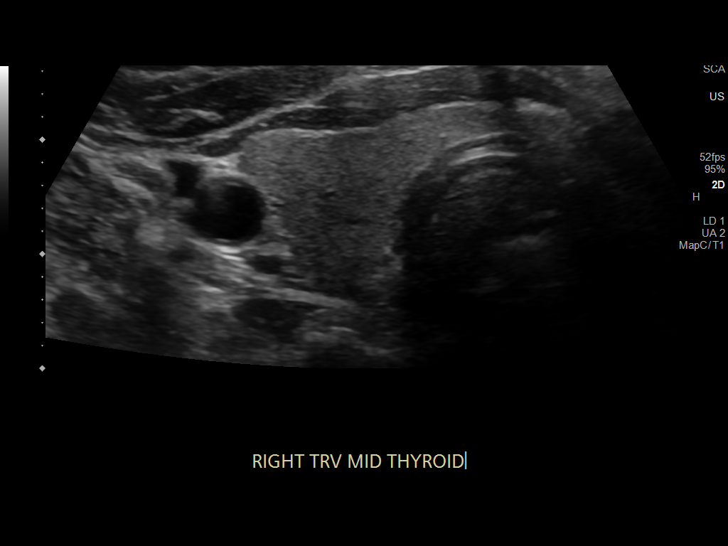
[im 15/32]
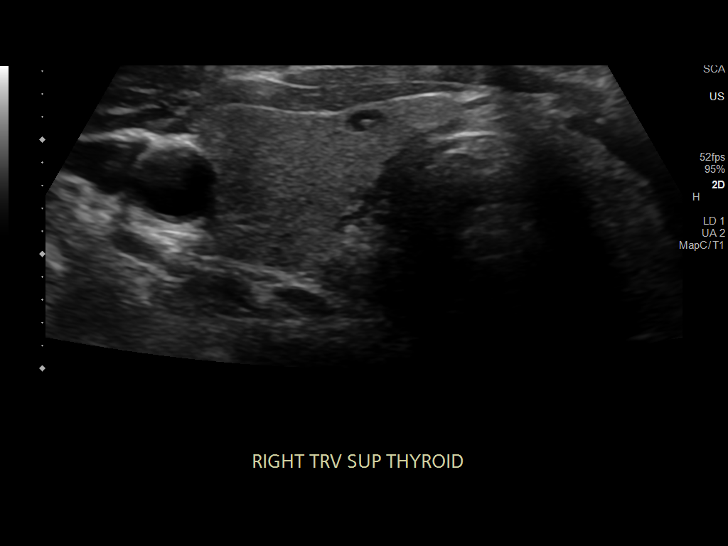
[im 17/32]
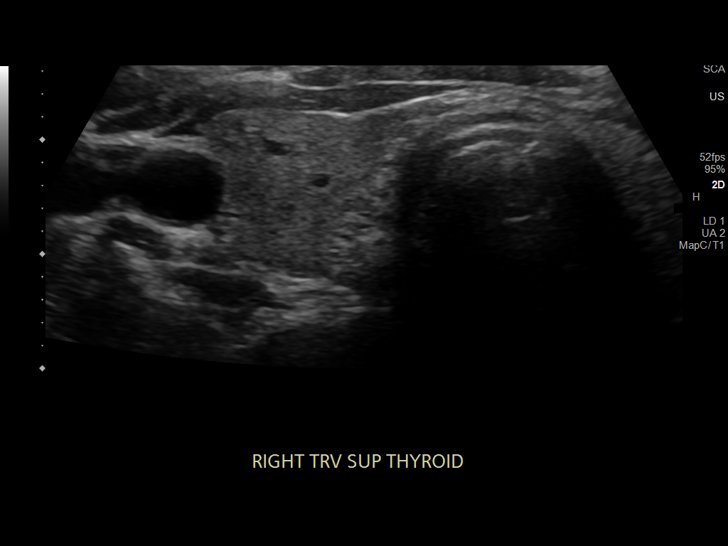
[im 20/32]
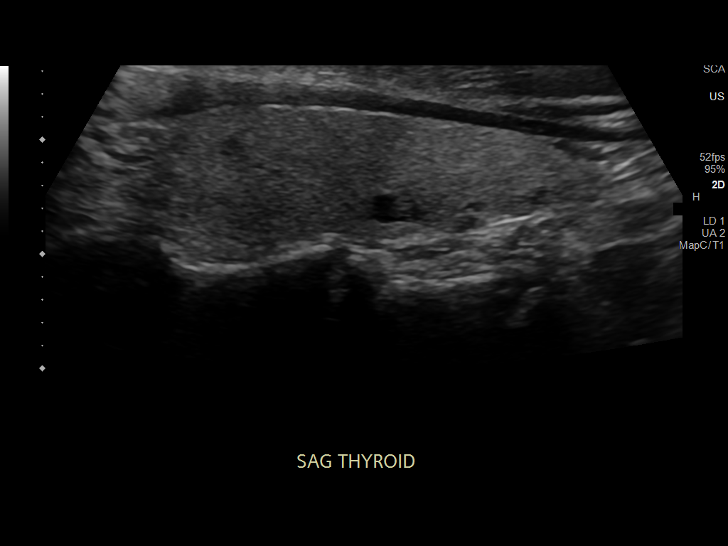
[im 21/32]
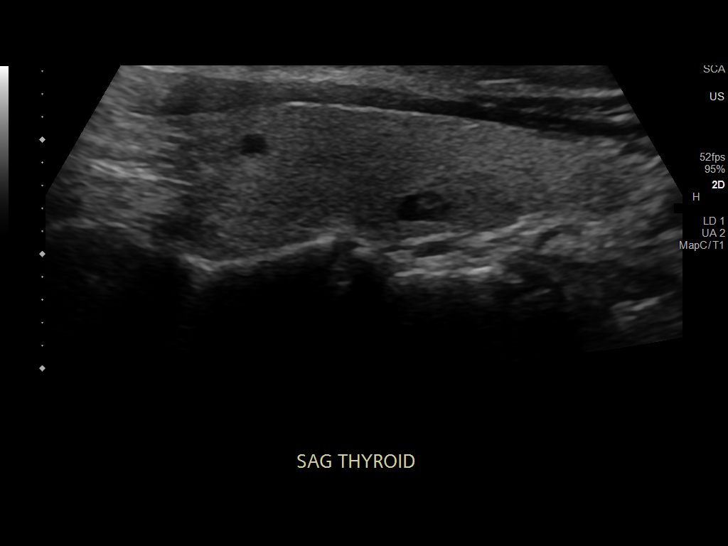
[im 24/32]
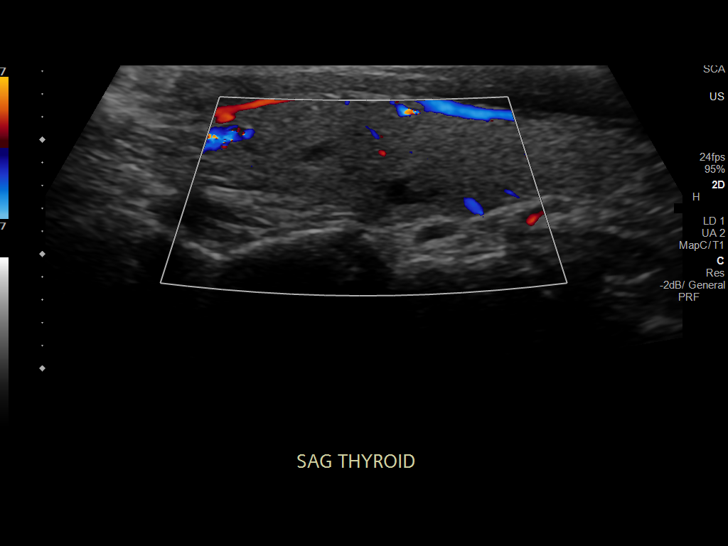
[im 26/32]
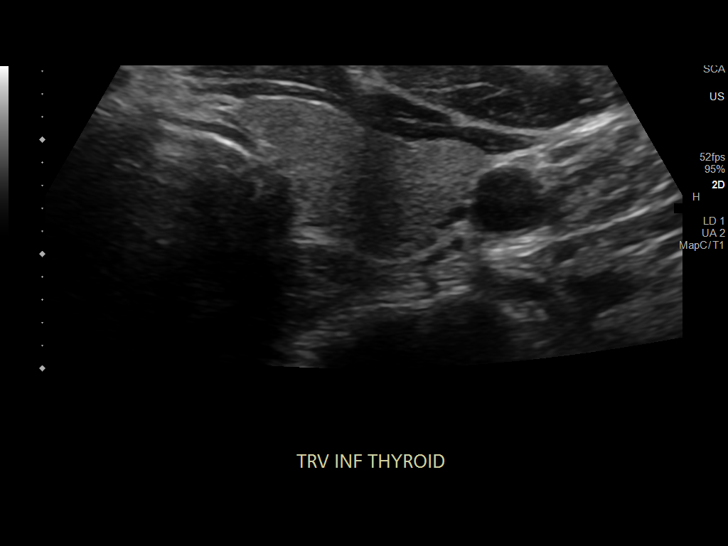
[im 29/32]
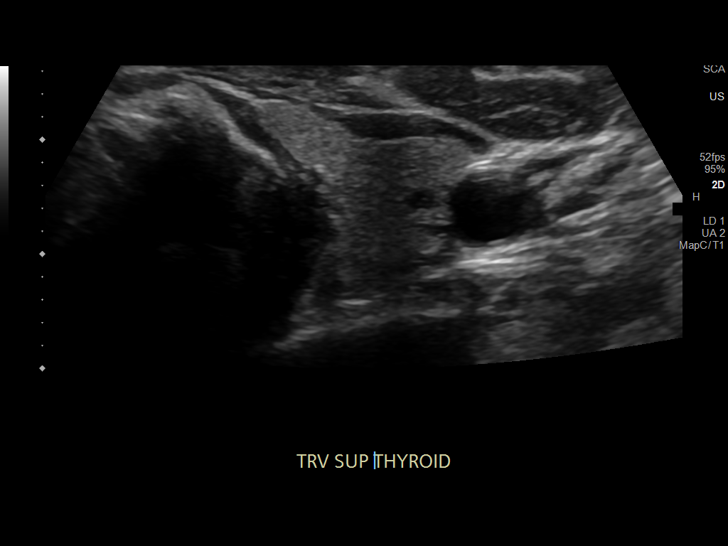
[im 32/32]
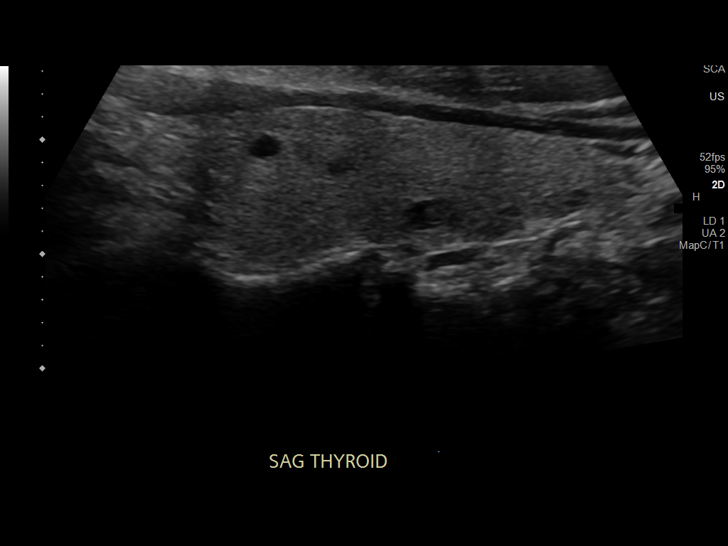

[14 of 25 positions shown; findings below may reference images not displayed]

FINDINGS: Parenchymal Echotexture: Mildly heterogenous

Isthmus: 4 mm

Right lobe: 3.7 x 1.6 x 1.5 cm

Left lobe: 4.0 x 1.3 x 1.7 cm

_________________________________________________________

Estimated total number of nodules >/= 1 cm: 0

Number of spongiform nodules >/=  2 cm not described below (TR1): 0

Number of mixed cystic and solid nodules >/= 1.5 cm not described
below (TR2): 0

_________________________________________________________

Minor thyroid heterogeneity. There are a few scattered subcentimeter
benign cystic nodules noted, all measuring 5 mm or less in size.
These would not meet criteria for any biopsy or follow-up. No other
significant thyroid abnormality. No hypervascularity. No regional
adenopathy.
IMPRESSION: Nonspecific minor thyroid heterogeneity and subcentimeter benign
cystic nodules.

The above is in keeping with the ACR TI-RADS recommendations - [HOSPITAL] 6834;[DATE].

## 2022-07-30 ENCOUNTER — Ambulatory Visit (INDEPENDENT_AMBULATORY_CARE_PROVIDER_SITE_OTHER): Payer: No Typology Code available for payment source | Admitting: Physician Assistant

## 2022-07-30 ENCOUNTER — Encounter: Payer: Self-pay | Admitting: Physician Assistant

## 2022-07-30 VITALS — BP 112/65 | HR 86 | Ht 68.0 in | Wt 142.0 lb

## 2022-07-30 DIAGNOSIS — F3340 Major depressive disorder, recurrent, in remission, unspecified: Secondary | ICD-10-CM

## 2022-07-30 DIAGNOSIS — F5101 Primary insomnia: Secondary | ICD-10-CM | POA: Diagnosis not present

## 2022-07-30 DIAGNOSIS — R040 Epistaxis: Secondary | ICD-10-CM

## 2022-07-30 DIAGNOSIS — E0590 Thyrotoxicosis, unspecified without thyrotoxic crisis or storm: Secondary | ICD-10-CM

## 2022-07-30 DIAGNOSIS — F411 Generalized anxiety disorder: Secondary | ICD-10-CM | POA: Diagnosis not present

## 2022-07-30 MED ORDER — BUSPIRONE HCL 30 MG PO TABS: 30.0000 mg | ORAL_TABLET | Freq: Two times a day (BID) | ORAL | 1 refills | Status: DC

## 2022-07-30 MED ORDER — ESZOPICLONE 3 MG PO TABS: 3.0000 mg | ORAL_TABLET | Freq: Every evening | ORAL | 1 refills | Status: DC | PRN

## 2022-07-30 MED ORDER — CITALOPRAM HYDROBROMIDE 40 MG PO TABS: 40.0000 mg | ORAL_TABLET | Freq: Every day | ORAL | 1 refills | Status: DC

## 2022-07-30 NOTE — Patient Instructions (Signed)
Nosebleed, Adult ?A nosebleed is when blood comes out of the nose. Nosebleeds are common and can be caused by many things. They are usually not a sign of a serious medical problem. ?Follow these instructions at home: ?When you have a nosebleed: ? ?Sit down. ?Tilt your head forward a little. ?Follow these steps: ?Pinch your nose with a clean towel or tissue. ?Keep pinching your nose for 5 minutes. Do not let go. ?After 5 minutes, let go of your nose. ?Keep doing these steps until the bleeding stops. ?Do not put tissues or other things in your nose to stop the bleeding. ?Avoid lying down or putting your head back. ?Use a nose spray decongestant as told by your doctor. ?After a nosebleed: ?Try not to blow your nose or sniffle for several hours. ?Try not to strain, lift, or bend at the waist for several days. ?Aspirin and medicines that thin your blood make bleeding more likely. If you take these medicines: ?Ask your doctor if you should stop taking them or if you should change how much you take. ?Do not stop taking the medicine unless your doctor tells you to. ?If your nosebleed was caused by dryness, use over-the-counter saline nasal spray or gel and a humidifier as told by your doctor. This will keep the inside of your nose moist and allow it to heal. If you need to use nasal spray or gel: ?Choose one that is water-soluble. ?Use only as much as you need and use it only as often as needed. ?Do not lie down right away after you use it. ?If you get nosebleeds often, talk with your doctor about treatments. These may include: ?Nasal cautery. A chemical swab or electrical device is used to lightly burn tiny blood vessels inside the nose. This helps stop or prevent nosebleeds. ?Nasal packing. A gauze or other material is placed in the nose to keep constant pressure on the bleeding area. ?Contact a doctor if: ?You have a fever. ?You get nosebleeds often. ?You get nosebleeds more often than usual. ?You bruise very  easily. ?You have something stuck in your nose. ?You are bleeding in your mouth. ?You vomit or cough up brown material. ?You get a nosebleed after you start a new medicine. ?Get help right away if: ?You have a nosebleed after you fall or hurt your head. ?Your nosebleed does not go away after 20 minutes. ?You feel dizzy or weak. ?You have unusual bleeding from other parts of your body. ?You have unusual bruising on other parts of your body. ?You get sweaty. ?You vomit blood. ?Summary ?Nosebleeds are common. They are usually not a sign of a serious medical problem. ?When you have a nosebleed, sit down and tilt your head a little forward. Pinch your nose with a clean tissue for 5 minutes. ?Use saline spray or saline gel and a humidifier as told by your doctor. ?Get help right away if your nosebleed does not go away after 20 minutes. ?This information is not intended to replace advice given to you by your health care provider. Make sure you discuss any questions you have with your health care provider. ?Document Revised: 09/26/2021 Document Reviewed: 09/26/2021 ?Elsevier Patient Education ? 2023 [employer] ? ?

## 2022-07-30 NOTE — Progress Notes (Signed)
Established Patient Office Visit  Subjective   Patient ID: Betty Stuart, female    DOB: May 03, 1961  Age: 62 y.o. MRN: [id number]  Chief Complaint  Patient presents with   Follow-up    HPI Pt is a 61 yo female with GAD, MDD, Insomnia, hyperthyroidism who presents to the clinic for 6 month follow up.   She is officially separated from husband. It has not gone great. He is being hard to deal with. She is living with her sister but cannot take her dogs there and still going to see her dogs three times a week. It is hard to keep going back to the house. She has increased her buspar on her own and it has helped. She would like to keep increased. She cannot afford counseling. No SI/HC.   She has had more right sided nose bleeds over the last month. Last one was Sunday night. She is able to stop the bleeding. She has stopped flonase and allergy medications.   Needs labs for thyroid.   .. 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    Objective:     BP 112/65   Pulse 86   Ht '5\' 8"'$  (1.727 m)   Wt 142 lb (64.4 kg)   LMP 06/07/2014   SpO2 100%   BMI 21.59 kg/m  BP Readings from Last 3 Encounters:  07/30/22 112/65  04/04/22 (!) 94/53  03/06/22 112/66   Wt Readings from Last 3 Encounters:  07/30/22 142 lb (64.4 kg)   04/04/22 143 lb (64.9 kg)  03/06/22 143 lb 9.6 oz (65.1 kg)    .Marland Kitchen    07/30/2022    8:40 AM 04/04/2022    7:50 AM 01/01/2022    3:18 PM 08/18/2021    8:21 AM 02/14/2021    9:06 AM  Depression screen PHQ 2/9  Decreased Interest 2 0 1 1 0  Down, Depressed, Hopeless '2 1 1 1 1  '$ PHQ - 2 Score '4 1 2 2 1  '$ Altered sleeping '2 2 3  2  '$ Tired, decreased energy '2 1 1  '$ 0  Change in appetite 2 0 1  0  Feeling bad or failure about yourself  1 0 0  1  Trouble concentrating '2 1 1  '$ 0  Moving slowly or fidgety/restless 2 0 0  0  Suicidal thoughts 1 0 0  0  PHQ-9 Score '16 5 8  4  '$ Difficult doing work/chores Somewhat difficult Not difficult at all Not difficult at all  Not difficult at all   .Marland Kitchen    07/30/2022    8:40 AM 04/04/2022    7:51 AM 01/01/2022    3:20 PM 02/14/2021    9:07 AM  GAD 7 : Generalized Anxiety Score  Nervous, Anxious, on Edge '3 1 1 '$ 0  Control/stop worrying 3  $'1 2 1  'u$ Worry too much - different things '2 1 1 1  '$ Trouble relaxing '2 1 2 '$ 0  Restless 1 0 1 0  Easily annoyed or irritable 0 0 1 1  Afraid - awful might happen 2 0 0 0  Total GAD 7 Score '13 4 8 3  '$ Anxiety Difficulty Somewhat difficult Not difficult at all Not difficult at all Not difficult at all      Physical Exam Constitutional:      Appearance: Normal appearance.  HENT:     Head: Normocephalic.     Right Ear: Tympanic membrane normal.     Left Ear: Tympanic membrane normal.     Nose:     Comments: Dried blood in right nare    Mouth/Throat:     Mouth: Mucous membranes are moist.     Pharynx: No oropharyngeal exudate or posterior oropharyngeal erythema.  Eyes:     Extraocular Movements: Extraocular movements intact.     Conjunctiva/sclera: Conjunctivae normal.     Pupils: Pupils are equal, round, and reactive to light.  Cardiovascular:     Rate and Rhythm: Normal rate and regular rhythm.  Pulmonary:     Effort: Pulmonary effort is normal.     Breath sounds: Normal breath sounds.  Musculoskeletal:      Cervical back: Normal range of motion and neck supple.     Right lower leg: No edema.     Left lower leg: No edema.  Neurological:     General: No focal deficit present.     Mental Status: She is alert.  Psychiatric:        Mood and Affect: Mood normal.         Assessment & Plan:  Marland KitchenMarland KitchenDawn was seen today for follow-up.  Diagnoses and all orders for this visit:  Recurrent major depressive disorder, in remission ([place]) -     citalopram (CELEXA) 40 MG tablet; Take 1 tablet (40 mg total) by mouth daily. -     COMPLETE METABOLIC PANEL WITH GFR  Primary insomnia -     Eszopiclone 3 MG TABS; Take 1 tablet (3 mg total) by mouth at bedtime as needed. -     COMPLETE METABOLIC PANEL WITH GFR  GAD (generalized anxiety disorder) -     busPIRone (BUSPAR) 30 MG tablet; Take 1 tablet (30 mg total) by mouth 2 (two) times daily. -     citalopram (CELEXA) 40 MG tablet; Take 1 tablet (40 mg total) by mouth daily. -     COMPLETE METABOLIC PANEL WITH GFR  Hyperthyroidism -     TSH + free T4  Right-sided nosebleed   PHQ/GAD numbers elevated Increased buspar to '30mg'$  bid Refilled celexa and lunesta TSH ordered to recheck Discussed self care  Discussed nose bleeds Given OTC ayr gel, get humidifer in room that she sleeps in Stop flonase Afrin for active bleed ENT if continues to bleed   Iran Planas, PA-C

## 2022-07-31 LAB — COMPLETE METABOLIC PANEL WITH GFR
AG Ratio: 1.6 (calc) (ref 1.0–2.5)
ALT: 12 U/L (ref 6–29)
AST: 12 U/L (ref 10–35)
Albumin: 4.1 g/dL (ref 3.6–5.1)
Alkaline phosphatase (APISO): 63 U/L (ref 37–153)
BUN: 13 mg/dL (ref 7–25)
CO2: 27 mmol/L (ref 20–32)
Calcium: 9.3 mg/dL (ref 8.6–10.4)
Chloride: 105 mmol/L (ref 98–110)
Creat: 0.92 mg/dL (ref 0.50–1.03)
Globulin: 2.5 g/dL (calc) (ref 1.9–3.7)
Glucose, Bld: 91 mg/dL (ref 65–99)
Potassium: 4.2 mmol/L (ref 3.5–5.3)
Sodium: 140 mmol/L (ref 135–146)
Total Bilirubin: 0.7 mg/dL (ref 0.2–1.2)
Total Protein: 6.6 g/dL (ref 6.1–8.1)
eGFR: 72 mL/min/{1.73_m2} (ref >=60)

## 2022-07-31 LAB — TSH+FREE T4: TSH W/REFLEX TO FT4: 1.64 mIU/L (ref 0.40–4.50)

## 2022-07-31 NOTE — Progress Notes (Signed)
Betty Stuart,   Thyroid looks great.  Kidney, liver, glucose look great.

## 2022-08-12 ENCOUNTER — Encounter: Payer: Self-pay | Admitting: Physician Assistant

## 2022-08-12 ENCOUNTER — Other Ambulatory Visit: Payer: Self-pay | Admitting: Physician Assistant

## 2022-08-12 DIAGNOSIS — F410 Panic disorder [episodic paroxysmal anxiety] without agoraphobia: Secondary | ICD-10-CM

## 2022-08-12 DIAGNOSIS — F411 Generalized anxiety disorder: Secondary | ICD-10-CM

## 2022-08-13 ENCOUNTER — Other Ambulatory Visit: Payer: Self-pay | Admitting: Physician Assistant

## 2022-08-13 MED ORDER — SULFAMETHOXAZOLE-TRIMETHOPRIM 800-160 MG PO TABS: 1.0000 | ORAL_TABLET | Freq: Two times a day (BID) | ORAL | 0 refills | Status: DC

## 2022-08-13 NOTE — Telephone Encounter (Signed)
Last written 07/05/2022 #90 no refills Last appt 07/30/2022

## 2022-08-24 ENCOUNTER — Encounter: Payer: Self-pay | Admitting: Internal Medicine

## 2022-09-05 ENCOUNTER — Encounter: Payer: Self-pay | Admitting: Internal Medicine

## 2022-09-05 ENCOUNTER — Ambulatory Visit (INDEPENDENT_AMBULATORY_CARE_PROVIDER_SITE_OTHER): Payer: No Typology Code available for payment source | Admitting: Internal Medicine

## 2022-09-05 VITALS — BP 124/62 | HR 80 | Ht 68.0 in | Wt 142.0 lb

## 2022-09-05 DIAGNOSIS — E0590 Thyrotoxicosis, unspecified without thyrotoxic crisis or storm: Secondary | ICD-10-CM | POA: Diagnosis not present

## 2022-09-05 LAB — TSH: TSH: 2.02 u[IU]/mL (ref 0.35–5.50)

## 2022-09-05 LAB — T4, FREE: Free T4: 0.87 ng/dL (ref 0.60–1.60)

## 2022-09-05 NOTE — Progress Notes (Unsigned)
Name: Betty Stuart  MRN/ DOB: [id number], 1961/07/21    Age/ Sex: 61 y.o., female    PCP: Donella Stade, PA-C   Reason for Endocrinology Evaluation: Subclinical Hyperthyroidism     Date of Initial Endocrinology Evaluation: 07/04/2021    HPI: [name] is a 61 y.o. female with a past medical history of GAD. The patient presented for initial endocrinology clinic visit on 07/04/2021 for consultative assistance with her Subclinical Hyperthyroidism.   She has been noted with suppressed TSH since 01/2021 with a nadir of 0.01 uIU/mL in 05/2021 with normal free T4 and T3.   She has noted weight loss at the time  Has chronic tremors  Has chronic depression, anxiety and insomnia which was worsening  Brittle nails and abdominal rash  Has had palpitations    On her initial visit to our clinic she was on methimazole 5 mg , started  in 05/2021  Methimazole has improved hand tremors    NO FH of thyroid disease   TRAB undetectable  Thyroid ultrasound 07/2021 showed sub-centimeter nodules      SUBJECTIVE:    Today (09/05/22):  Betty Stuart is here for follow-up on hyperthyroidism.  Weight has been stable  She started having palpitations and muscle weakness , going through separation  Appetite is improving  Tremors stable  Denies constipation or diarrhea  Denies local neck swelling    Methimazole 5 mg, half a tablet daily     HISTORY:  Past Medical History: No past medical history on file. Past Surgical History:  Past Surgical History:  Procedure Laterality Date   BUNIONECTOMY     CESAREAN SECTION     herniated disk      Social History:  reports that she has never smoked. She has never used smokeless tobacco. She reports current alcohol use of about 1.0 standard drink of alcohol per week. She reports that she does not use drugs. Family History: family history includes Alcohol abuse in her maternal uncle and paternal uncle.   HOME MEDICATIONS: Allergies  as of 09/05/2022       Reactions   Hydrocodone Nausea And Vomiting   Remeron [mirtazapine]    Not effective.    Tramadol Nausea And Vomiting   Zoloft [sertraline Hcl] Rash        Medication List        Accurate as of September 05, 2022 12:53 PM. If you have any questions, ask your nurse or doctor.          STOP taking these medications    ALLERGY PO Stopped by: Dorita Sciara, MD   sulfamethoxazole-trimethoprim 800-160 MG tablet Commonly known as: BACTRIM DS Stopped by: Dorita Sciara, MD       TAKE these medications    busPIRone 30 MG tablet Commonly known as: BUSPAR Take 1 tablet (30 mg total) by mouth 2 (two) times daily.   citalopram 40 MG tablet Commonly known as: CELEXA Take 1 tablet (40 mg total) by mouth daily.   Eszopiclone 3 MG Tabs Take 1 tablet (3 mg total) by mouth at bedtime as needed.   ibuprofen 100 MG tablet Commonly known as: ADVIL Take 100 mg by mouth as needed for fever.   LORazepam 0.5 MG tablet Commonly known as: ATIVAN TAKE 1 TABLET BY MOUTH EVERY 8 HOURS AS NEEDED FOR ANXIETY   methimazole 5 MG tablet Commonly known as: TAPAZOLE Take 0.5 tablets (2.5 mg total) by mouth daily.   TYLENOL 8 HOUR PO  Take by mouth as needed.   valACYclovir 1000 MG tablet Commonly known as: VALTREX Take 1,000 mg by mouth 2 (two) times daily.          REVIEW OF SYSTEMS: A comprehensive ROS was conducted with the patient and is negative except as per HPI     OBJECTIVE:  VS: BP 124/62 (BP Location: Left Arm, Patient Position: Sitting, Cuff Size: Small)   Pulse 80   Ht '5\' 8"'$  (1.727 m)   Wt 142 lb (64.4 kg)   LMP 06/07/2014   SpO2 98%   BMI 21.59 kg/m    Wt Readings from Last 3 Encounters:  09/05/22 142 lb (64.4 kg)  07/30/22 142 lb (64.4 kg)  04/04/22 143 lb (64.9 kg)     EXAM: General: Pt appears well and is in NAD  Eyes: External eye exam normal without stare, lid lag or exophthalmos.  EOM intact.  PERRL.  Neck:  General: Supple without adenopathy. Thyroid: Thyroid size normal.  No goiter or nodules appreciated. No thyroid bruit.  Lungs: Clear with good BS bilat with no rales, rhonchi, or wheezes  Heart: Auscultation: RRR.  Abdomen: Normoactive bowel sounds, soft, nontender, without masses or organomegaly palpable  Extremities:  BL LE: No pretibial edema normal ROM and strength.  Mental Status: Judgment, insight: Intact Orientation: Oriented to time, place, and person Mood and affect: No depression, anxiety, or agitation     DATA REVIEWED:       Latest Reference Range & Units 11/07/21 08:05  Sodium 135 - 145 mEq/L 140  Potassium 3.5 - 5.1 mEq/L 4.6  Chloride 96 - 112 mEq/L 102  CO2 19 - 32 mEq/L 34 (H)  Glucose 70 - 99 mg/dL 78  BUN 6 - 23 mg/dL 17  Creatinine 0.40 - 1.20 mg/dL 1.02  Calcium 8.4 - 10.5 mg/dL 9.7  Alkaline Phosphatase 39 - 117 U/L 61  Albumin 3.5 - 5.2 g/dL 4.2  AST 0 - 37 U/L 17  ALT 0 - 35 U/L 19  Total Protein 6.0 - 8.3 g/dL 6.5  Total Bilirubin 0.2 - 1.2 mg/dL 0.6  GFR >60.00 mL/min 60.63     Latest Reference Range & Units 07/04/21 10:42  TRAB <=2.00 IU/L <1.00    Thyroid ultrasound 07/13/2021  Estimated total number of nodules >/= 1 cm: 0   Number of spongiform nodules >/=  2 cm not described below (TR1): 0   Number of mixed cystic and solid nodules >/= 1.5 cm not described below ([place]): 0   _________________________________________________________   Minor thyroid heterogeneity. There are a few scattered subcentimeter benign cystic nodules noted, all measuring 5 mm or less in size. These would not meet criteria for any biopsy or follow-up. No other significant thyroid abnormality. No hypervascularity. No regional adenopathy.   IMPRESSION: Nonspecific minor thyroid heterogeneity and subcentimeter benign cystic nodules.     ASSESSMENT/PLAN/RECOMMENDATIONS:   Subclinical Hyperthyroidism:   - Pt is clinically euthyroid  - No local neck  symptoms  -TRAb was undetectable, and her thyroid ultrasound showed subcentimeter nodules. - TFT's normal today , will continue current dose of methimazole     Medications : Continue methimazole 5 mg , half a tablet daily    F/U in 6 months   Signed electronically by: Mack Guise, MD  [facility] Endocrinology  [facility] [address] [place], [place] [address] Phone: [phone] FAX: [phone]   CC: [facility] [address] [place]  [place] [address] Phone: [phone] Fax: [phone]   Return to Endocrinology clinic as below: Future Appointments  Date Time Provider [place]  09/05/2022  1:00 PM Chon Buhl, Melanie Crazier, MD [facility] None  01/29/2023  8:30 AM Donella Stade, PA-C [facility] None

## 2022-09-06 LAB — T3: T3, Total: 94 ng/dL (ref 76–181)

## 2022-09-06 MED ORDER — METHIMAZOLE 5 MG PO TABS: 2.5000 mg | ORAL_TABLET | Freq: Every day | ORAL | 2 refills | Status: DC

## 2022-10-05 ENCOUNTER — Ambulatory Visit: Payer: No Typology Code available for payment source | Admitting: Physician Assistant

## 2022-10-08 ENCOUNTER — Other Ambulatory Visit: Payer: Self-pay | Admitting: Physician Assistant

## 2022-10-08 DIAGNOSIS — F410 Panic disorder [episodic paroxysmal anxiety] without agoraphobia: Secondary | ICD-10-CM

## 2022-10-08 DIAGNOSIS — F411 Generalized anxiety disorder: Secondary | ICD-10-CM

## 2022-10-09 MED ORDER — LORAZEPAM 0.5 MG PO TABS: 0.5000 mg | ORAL_TABLET | Freq: Three times a day (TID) | ORAL | 0 refills | Status: DC | PRN

## 2022-10-09 NOTE — Telephone Encounter (Signed)
Last OV: 07/20/2022 Next OV: 01/29/2023 Last Labs: 07/31/2023 Last RF: 08/12/2022

## 2022-12-16 ENCOUNTER — Encounter: Payer: Self-pay | Admitting: Physician Assistant

## 2022-12-17 MED ORDER — BUSPIRONE HCL 15 MG PO TABS: 30.0000 mg | ORAL_TABLET | Freq: Two times a day (BID) | ORAL | 2 refills | Status: DC

## 2023-01-13 ENCOUNTER — Other Ambulatory Visit: Payer: Self-pay | Admitting: Physician Assistant

## 2023-01-13 DIAGNOSIS — F411 Generalized anxiety disorder: Secondary | ICD-10-CM

## 2023-01-13 DIAGNOSIS — F410 Panic disorder [episodic paroxysmal anxiety] without agoraphobia: Secondary | ICD-10-CM

## 2023-01-14 NOTE — Telephone Encounter (Signed)
Last OV: 07/30/22 Next OV: 01/29/23 Last RF: 10/09/22

## 2023-01-29 ENCOUNTER — Ambulatory Visit: Payer: No Typology Code available for payment source | Admitting: Physician Assistant

## 2023-01-30 ENCOUNTER — Ambulatory Visit (INDEPENDENT_AMBULATORY_CARE_PROVIDER_SITE_OTHER): Payer: No Typology Code available for payment source | Admitting: Physician Assistant

## 2023-01-30 ENCOUNTER — Encounter: Payer: Self-pay | Admitting: Physician Assistant

## 2023-01-30 VITALS — BP 107/65 | HR 72 | Ht 68.0 in | Wt 144.0 lb

## 2023-01-30 DIAGNOSIS — F5101 Primary insomnia: Secondary | ICD-10-CM

## 2023-01-30 DIAGNOSIS — Z1211 Encounter for screening for malignant neoplasm of colon: Secondary | ICD-10-CM

## 2023-01-30 DIAGNOSIS — F411 Generalized anxiety disorder: Secondary | ICD-10-CM

## 2023-01-30 DIAGNOSIS — F410 Panic disorder [episodic paroxysmal anxiety] without agoraphobia: Secondary | ICD-10-CM

## 2023-01-30 DIAGNOSIS — F3340 Major depressive disorder, recurrent, in remission, unspecified: Secondary | ICD-10-CM

## 2023-01-30 DIAGNOSIS — B351 Tinea unguium: Secondary | ICD-10-CM

## 2023-01-30 MED ORDER — CICLOPIROX 8 % EX SOLN: Freq: Every day | CUTANEOUS | 0 refills | Status: DC

## 2023-01-30 MED ORDER — CITALOPRAM HYDROBROMIDE 40 MG PO TABS: 40.0000 mg | ORAL_TABLET | Freq: Every day | ORAL | 1 refills | Status: DC

## 2023-01-30 MED ORDER — ESZOPICLONE 3 MG PO TABS: 3.0000 mg | ORAL_TABLET | Freq: Every evening | ORAL | 1 refills | Status: DC | PRN

## 2023-01-30 MED ORDER — LORAZEPAM 0.5 MG PO TABS: 0.5000 mg | ORAL_TABLET | Freq: Three times a day (TID) | ORAL | 0 refills | Status: DC | PRN

## 2023-01-30 NOTE — Progress Notes (Signed)
Established Patient Office Visit  Subjective   Patient ID: Betty Stuart, female    DOB: [birth year]  Age: 62 y.o. MRN: [id number]  Chief Complaint  Patient presents with   Follow-up    HPI Pt is a 62 yo female with MDD, GAD, insomnia who needs refills.   She is doing ok. She is separated from husband and living alone with her dogs. She is much happier. She is still at her same job and doing well there. She is stable on all medications. No concerns. No SI/HC. She rarely uses buspar. She does need refills on everything.   Hx of toenail fungus and noticed some discoloration on left great toenail.   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     Objective:     BP 107/65   Pulse 72   Ht 5\' 8"  (1.727 m)   Wt 144 lb (65.3 kg)   LMP 06/07/2014   SpO2 99%   BMI 21.90 kg/m  BP Readings from Last 3 Encounters:  01/30/23 107/65  09/05/22 124/62  07/30/22 112/65   Wt Readings from Last 3 Encounters:  01/30/23 144 lb (65.3 kg)  09/05/22 142 lb (64.4 kg)  07/30/22 142 lb (64.4 kg)   ..    01/30/2023    9:49 AM 07/30/2022    8:40 AM 04/04/2022    7:50 AM 01/01/2022    3:18 PM 08/18/2021    8:21 AM  Depression screen PHQ 2/9  Decreased Interest 0 2 0 1 1  Down,  Depressed, Hopeless 1 2 1 1 1   PHQ - 2 Score 1 4 1 2 2   Altered sleeping 2 2 2 3    Tired, decreased energy 1 2 1 1    Change in appetite 0 2 0 1   Feeling bad or failure about yourself  0 1 0 0   Trouble concentrating 1 2 1 1    Moving slowly or fidgety/restless 0 2 0 0   Suicidal thoughts 0 1 0 0   PHQ-9 Score 5 16 5 8    Difficult doing work/chores Not difficult at all Somewhat difficult Not difficult at all Not difficult at all    ..    01/30/2023    9:49 AM 07/30/2022    8:40 AM 04/04/2022    7:51 AM 01/01/2022    3:20 PM  GAD 7 : Generalized Anxiety Score  Nervous, Anxious, on Edge 1 3 1 1   Control/stop worrying 1 3 1 2   Worry too much - different things 0 2 1 1   Trouble relaxing 0 2 1 2   Restless 0 1 0 1  Easily annoyed or irritable 1 0 0 1  Afraid - awful might happen 0 2 0 0  Total GAD 7 Score 3 13 4  8  Anxiety Difficulty Not difficult at all Somewhat difficult Not difficult at all Not difficult at all        Physical Exam Constitutional:      Appearance: Normal appearance.  HENT:     Head: Normocephalic.  Cardiovascular:     Rate and Rhythm: Normal rate and regular rhythm.     Pulses: Normal pulses.     Heart sounds: Normal heart sounds.  Pulmonary:     Effort: Pulmonary effort is normal.     Breath sounds: Normal breath sounds.  Musculoskeletal:     Right lower leg: No edema.     Left lower leg: No edema.  Skin:    Comments: Left great toenail discoloration at the base lateral nail.   Neurological:     General: No focal deficit present.     Mental Status: She is alert and oriented to person, place, and time.  Psychiatric:        Mood and Affect: Mood normal.       The 10-year ASCVD risk score (Arnett DK, et al., 2019) is: 1.3%    Assessment & Plan:  Marland KitchenMarland KitchenDawn was seen today for follow-up.  Diagnoses and all orders for this visit:  GAD (generalized anxiety disorder) -     citalopram (CELEXA) 40 MG tablet; Take 1 tablet (40 mg total) by mouth  daily. -     LORazepam (ATIVAN) 0.5 MG tablet; Take 1 tablet (0.5 mg total) by mouth every 8 (eight) hours as needed. for anxiety  Recurrent major depressive disorder, in remission (HCC) -     citalopram (CELEXA) 40 MG tablet; Take 1 tablet (40 mg total) by mouth daily.  Primary insomnia -     Eszopiclone 3 MG TABS; Take 1 tablet (3 mg total) by mouth at bedtime as needed.  Panic attack -     LORazepam (ATIVAN) 0.5 MG tablet; Take 1 tablet (0.5 mg total) by mouth every 8 (eight) hours as needed. for anxiety  Colon cancer screening -     Cologuard  Toenail fungus -     ciclopirox (PENLAC) 8 % solution; Apply topically at bedtime. Apply over nail and surrounding skin. Apply daily over previous coat. After seven (7) days, may remove with alcohol and continue cycle.   PHQ/GAD stable Refills sent Labs UTD Strongly encouraged getting cologuard, pt agrees Discussed toenail fungus start penlac Follow up as needed   Return in about 6 months (around 08/02/2023).    Tandy Gaw, PA-C

## 2023-02-27 LAB — COLOGUARD: COLOGUARD: NEGATIVE

## 2023-02-27 NOTE — Progress Notes (Signed)
Negative cologuard. Re-screen in 3 years.

## 2023-02-27 NOTE — Progress Notes (Signed)
Please sent letter since no serviceable phone

## 2023-03-07 ENCOUNTER — Ambulatory Visit: Payer: No Typology Code available for payment source | Admitting: Internal Medicine

## 2023-03-12 ENCOUNTER — Ambulatory Visit (INDEPENDENT_AMBULATORY_CARE_PROVIDER_SITE_OTHER): Payer: No Typology Code available for payment source | Admitting: Internal Medicine

## 2023-03-12 ENCOUNTER — Encounter: Payer: Self-pay | Admitting: Internal Medicine

## 2023-03-12 VITALS — BP 110/74 | HR 68 | Ht 68.0 in | Wt 145.0 lb

## 2023-03-12 DIAGNOSIS — E0590 Thyrotoxicosis, unspecified without thyrotoxic crisis or storm: Secondary | ICD-10-CM | POA: Diagnosis not present

## 2023-03-12 LAB — TSH: TSH: 2.39 u[IU]/mL (ref 0.35–5.50)

## 2023-03-12 LAB — T4, FREE: Free T4: 0.77 ng/dL (ref 0.60–1.60)

## 2023-03-12 NOTE — Progress Notes (Unsigned)
Name: Betty Stuart  MRN/ DOB: [id number], June 06, 1961    Age/ Sex: 62 y.o., female    PCP: Jomarie Longs, PA-C   Reason for Endocrinology Evaluation: Subclinical Hyperthyroidism     Date of Initial Endocrinology Evaluation: 07/04/2021    HPI: Betty Stuart is a 62 y.o. female with a past medical history of GAD. The patient presented for initial endocrinology clinic visit on 07/04/2021 for consultative assistance with her Subclinical Hyperthyroidism.   She has been noted with suppressed TSH since 01/2021 with a nadir of 0.01 uIU/mL in 05/2021 with normal free T4 and T3.    On her initial visit to our clinic she was on methimazole 5 mg , started  in 05/2021  Methimazole has improved hand tremors    NO FH of thyroid disease   TRAB undetectable  Thyroid ultrasound 07/2021 showed sub-centimeter nodules      SUBJECTIVE:    Today (03/12/23):  [name] is here for follow-up on hyperthyroidism.  Weight has been trending up Denies eye symptoms Denies local neck symptoms Tremors have resolved Palpitations have resolved, pending divorce She has chronic alternating constipation and diarrhea  Methimazole 5 mg, half a tablet daily     HISTORY:  Past Medical History: No past medical history on file. Past Surgical History:  Past Surgical History:  Procedure Laterality Date   BUNIONECTOMY     CESAREAN SECTION     herniated disk      Social History:  reports that she has never smoked. She has never used smokeless tobacco. She reports current alcohol use of about 1.0 standard drink of alcohol per week. She reports that she does not use drugs. Family History: family history includes Alcohol abuse in her maternal uncle and paternal uncle.   HOME MEDICATIONS: Allergies as of 03/12/2023       Reactions   Hydrocodone Nausea And Vomiting   Remeron [mirtazapine]    Not effective.    Tramadol Nausea And Vomiting   Zoloft [sertraline Hcl] Rash        Medication  List        Accurate as of March 12, 2023 12:16 PM. If you have any questions, ask your nurse or doctor.          busPIRone 15 MG tablet Commonly known as: BUSPAR Take 2 tablets (30 mg total) by mouth 2 (two) times daily.   ciclopirox 8 % solution Commonly known as: PENLAC Apply topically at bedtime. Apply over nail and surrounding skin. Apply daily over previous coat. After seven (7) days, may remove with alcohol and continue cycle.   citalopram 40 MG tablet Commonly known as: CELEXA Take 1 tablet (40 mg total) by mouth daily.   Eszopiclone 3 MG Tabs Take 1 tablet (3 mg total) by mouth at bedtime as needed.   ibuprofen 100 MG tablet Commonly known as: ADVIL Take 100 mg by mouth as needed for fever.   LORazepam 0.5 MG tablet Commonly known as: ATIVAN Take 1 tablet (0.5 mg total) by mouth every 8 (eight) hours as needed. for anxiety   methimazole 5 MG tablet Commonly known as: TAPAZOLE Take 0.5 tablets (2.5 mg total) by mouth daily.   TYLENOL 8 HOUR PO Take by mouth as needed.   valACYclovir 1000 MG tablet Commonly known as: VALTREX Take 1,000 mg by mouth 2 (two) times daily.          REVIEW OF SYSTEMS: A comprehensive ROS was conducted with the patient and is  negative except as per HPI     OBJECTIVE:  VS: BP 110/74 (BP Location: Left Arm, Patient Position: Sitting, Cuff Size: Large)   Pulse 68   Ht 5\' 8"  (1.727 m)   Wt 145 lb (65.8 kg)   LMP 06/07/2014   SpO2 95%   BMI 22.05 kg/m    Wt Readings from Last 3 Encounters:  03/12/23 145 lb (65.8 kg)  01/30/23 144 lb (65.3 kg)  09/05/22 142 lb (64.4 kg)     EXAM: General: Pt appears well and is in NAD  Eyes: External eye exam normal without stare, lid lag or exophthalmos.  EOM intact.  Neck: General: Supple without adenopathy. Thyroid: Thyroid size normal.  No goiter or nodules appreciated. No thyroid bruit.  Lungs: Clear with good BS bilat   Heart: Auscultation: RRR.  Abdomen: soft, nontender   Extremities:  BL LE: No pretibial edema normal ROM and strength.  Mental Status: Judgment, insight: Intact Orientation: Oriented to time, place, and person Mood and affect: No depression, anxiety, or agitation     DATA REVIEWED:      Latest Reference Range & Units 07/30/22 00:00  Sodium 135 - 146 mmol/L 140  Potassium 3.5 - 5.3 mmol/L 4.2  Chloride 98 - 110 mmol/L 105  CO2 20 - 32 mmol/L 27  Glucose 65 - 99 mg/dL 91  BUN 7 - 25 mg/dL 13  Creatinine 1.61 - 0.96 mg/dL 0.45  Calcium 8.6 - 40.9 mg/dL 9.3  BUN/Creatinine Ratio 6 - 22 (calc) SEE NOTE:  eGFR > OR = 60 mL/min/1.80m2 72  AG Ratio 1.0 - 2.5 (calc) 1.6  AST 10 - 35 U/L 12  ALT 6 - 29 U/L 12  Total Protein 6.1 - 8.1 g/dL 6.6  Total Bilirubin 0.2 - 1.2 mg/dL 0.7    Latest Reference Range & Units 09/05/22 13:09  TSH 0.35 - 5.50 uIU/mL 2.02  Triiodothyronine (T3) 76 - 181 ng/dL 94  W1,XBJY(NWGNFA) 2.13 - 1.60 ng/dL 0.86     Latest Reference Range & Units 07/04/21 10:42  TRAB <=2.00 IU/L <1.00    Thyroid ultrasound 07/13/2021  Estimated total number of nodules >/= 1 cm: 0   Number of spongiform nodules >/=  2 cm not described below (TR1): 0   Number of mixed cystic and solid nodules >/= 1.5 cm not described below (TR2): 0   _________________________________________________________   Minor thyroid heterogeneity. There are a few scattered subcentimeter benign cystic nodules noted, all measuring 5 mm or less in size. These would not meet criteria for any biopsy or follow-up. No other significant thyroid abnormality. No hypervascularity. No regional adenopathy.   IMPRESSION: Nonspecific minor thyroid heterogeneity and subcentimeter benign cystic nodules.     ASSESSMENT/PLAN/RECOMMENDATIONS:   Subclinical Hyperthyroidism:   - Pt is clinically euthyroid  - No local neck symptoms  -TRAb was undetectable, and her thyroid ultrasound showed subcentimeter nodules. - TFT's normal today , will continue  current dose of methimazole     Medications : Continue methimazole 5 mg , half a tablet daily    F/U in 6 months   Signed electronically by: Lyndle Herrlich, MD  [facility] Endocrinology  [facility] [address] [place] [place], [place] [address] Phone: [phone] FAX: [phone]   CC: [facility] [address] [place] [address] Phone: [phone] Fax: [phone]   Return to Endocrinology clinic as below: Future Appointments  Date Time Provider Department Center  08/02/2023  8:10  AM Breeback, Jade L, PA-C [facility] None

## 2023-03-13 LAB — T3: T3, Total: 88 ng/dL (ref 76–181)

## 2023-03-13 MED ORDER — METHIMAZOLE 5 MG PO TABS: 2.5000 mg | ORAL_TABLET | ORAL | 2 refills | Status: DC

## 2023-03-18 ENCOUNTER — Other Ambulatory Visit: Payer: Self-pay | Admitting: Physician Assistant

## 2023-03-18 DIAGNOSIS — F411 Generalized anxiety disorder: Secondary | ICD-10-CM

## 2023-05-14 ENCOUNTER — Other Ambulatory Visit (INDEPENDENT_AMBULATORY_CARE_PROVIDER_SITE_OTHER): Payer: No Typology Code available for payment source

## 2023-05-14 DIAGNOSIS — E0590 Thyrotoxicosis, unspecified without thyrotoxic crisis or storm: Secondary | ICD-10-CM | POA: Diagnosis not present

## 2023-05-14 LAB — T4, FREE: Free T4: 0.91 ng/dL (ref 0.60–1.60)

## 2023-05-14 LAB — TSH: TSH: 1.22 u[IU]/mL (ref 0.35–5.50)

## 2023-07-11 ENCOUNTER — Other Ambulatory Visit: Payer: Self-pay | Admitting: Physician Assistant

## 2023-07-11 DIAGNOSIS — F411 Generalized anxiety disorder: Secondary | ICD-10-CM

## 2023-07-11 DIAGNOSIS — F410 Panic disorder [episodic paroxysmal anxiety] without agoraphobia: Secondary | ICD-10-CM

## 2023-08-02 ENCOUNTER — Encounter: Payer: Self-pay | Admitting: Physician Assistant

## 2023-08-02 ENCOUNTER — Ambulatory Visit (INDEPENDENT_AMBULATORY_CARE_PROVIDER_SITE_OTHER): Payer: No Typology Code available for payment source | Admitting: Physician Assistant

## 2023-08-02 VITALS — BP 115/71 | HR 71 | Ht 68.0 in | Wt 147.0 lb

## 2023-08-02 DIAGNOSIS — F3340 Major depressive disorder, recurrent, in remission, unspecified: Secondary | ICD-10-CM

## 2023-08-02 DIAGNOSIS — E0590 Thyrotoxicosis, unspecified without thyrotoxic crisis or storm: Secondary | ICD-10-CM | POA: Insufficient documentation

## 2023-08-02 DIAGNOSIS — F410 Panic disorder [episodic paroxysmal anxiety] without agoraphobia: Secondary | ICD-10-CM | POA: Diagnosis not present

## 2023-08-02 DIAGNOSIS — F411 Generalized anxiety disorder: Secondary | ICD-10-CM | POA: Diagnosis not present

## 2023-08-02 DIAGNOSIS — F5101 Primary insomnia: Secondary | ICD-10-CM

## 2023-08-02 MED ORDER — ESZOPICLONE 3 MG PO TABS: 3.0000 mg | ORAL_TABLET | Freq: Every evening | ORAL | 1 refills | Status: DC | PRN

## 2023-08-02 MED ORDER — CITALOPRAM HYDROBROMIDE 40 MG PO TABS
40.0000 mg | ORAL_TABLET | Freq: Every day | ORAL | 1 refills | Status: DC
Start: 2023-08-02 — End: 2023-10-03

## 2023-08-02 NOTE — Progress Notes (Signed)
Established Patient Office Visit  Subjective   Patient ID: Betty Stuart, female    DOB: 1961-05-24  Age: 62 y.o. MRN: [id number]  No chief complaint on file.   HPI Pt is a 62 yo female with GAD, MDD, Hyperthyroidism, Insomnia who presents to the clinic for medication refills.   Pt is doing really well. Her divorce is final. She bought a house of her own. She is happy. She is sleeping well and feeling better. No concerns today.   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 No pertinent past medical history. Past Surgical History:  Procedure Laterality Date   BUNIONECTOMY     CESAREAN SECTION     herniated disk     Family History  Problem Relation Age of Onset   Alcohol abuse Maternal Uncle    Alcohol abuse Paternal Uncle    Allergies  Allergen Reactions   Hydrocodone Nausea And Vomiting   Remeron [Mirtazapine]     Not effective.    Tramadol Nausea And Vomiting   Zoloft [Sertraline Hcl] Rash     Review of Systems  All other systems reviewed and are negative.     Objective:     BP 115/71   Pulse 71   Ht 5\' 8"  (1.727 m)   Wt 147 lb (66.7 kg)   LMP 06/07/2014   SpO2 99%   BMI 22.35 kg/m  BP Readings from Last 3 Encounters:  08/02/23 115/71  03/12/23 110/74  01/30/23 107/65   Wt Readings from Last 3 Encounters:  08/02/23 147 lb (66.7 kg)  03/12/23 145 lb (65.8 kg)  01/30/23 144 lb (65.3 kg)     .Marland Kitchen    08/02/2023    8:32 AM 01/30/2023    9:49 AM 07/30/2022    8:40 AM 04/04/2022    7:51 AM  GAD 7 : Generalized Anxiety Score  Nervous, Anxious, on Edge 0 1 3 1   Control/stop worrying 0 1 3 1   Worry too much - different things 0 0 2 1  Trouble relaxing 1 0 2 1  Restless 0 0 1 0  Easily annoyed or irritable 1 1 0 0  Afraid - awful might happen 0 0 2 0  Total GAD 7 Score 2 3 13 4   Anxiety Difficulty Not difficult at all Not difficult at all Somewhat difficult Not difficult at all    .Marland Kitchen    08/02/2023    8:32 AM 01/30/2023    9:49 AM 07/30/2022    8:40 AM 04/04/2022    7:50 AM 01/01/2022    3:18 PM  Depression screen PHQ 2/9  Decreased Interest 0 0 2 0 1  Down, Depressed, Hopeless 0 1 2 1 1   PHQ - 2 Score 0 1 4 1 2   Altered sleeping 1 2 2 2 3   Tired, decreased energy 1 1 2 1 1   Change in appetite 0 0 2 0 1  Feeling bad or failure about yourself  0 0 1 0 0  Trouble concentrating 1 1 2 1 1   Moving slowly or fidgety/restless 0 0 2 0 0  Suicidal thoughts 0 0 1 0 0  PHQ-9 Score 3 5 16 5 8   Difficult doing work/chores Not difficult at all Not difficult at all Somewhat difficult Not difficult at all Not difficult at all     Physical Exam Constitutional:      Appearance: Normal appearance.  HENT:     Head: Normocephalic.  Cardiovascular:     Rate and Rhythm: Normal rate and regular rhythm.  Pulmonary:     Effort: Pulmonary effort is normal.     Breath sounds: Normal breath sounds.  Musculoskeletal:     Right lower leg: No edema.     Left lower leg: No edema.  Neurological:     General: No focal deficit present.     Mental Status: She is alert and oriented to person, place, and time.  Psychiatric:        Mood and Affect: Mood normal.       The 10-year ASCVD risk score (Arnett DK, et al., 2019) is: 1.7%    Assessment & Plan:  .Diagnoses and all orders for this visit:  GAD (generalized anxiety disorder) -     citalopram (CELEXA) 40 MG tablet; Take 1 tablet (40 mg  total) by mouth daily.  Recurrent major depressive disorder, in remission (HCC) -     citalopram (CELEXA) 40 MG tablet; Take 1 tablet (40 mg total) by mouth daily.  Primary insomnia -     Eszopiclone 3 MG TABS; Take 1 tablet (3 mg total) by mouth at bedtime as needed.  Panic attack  Hyperthyroidism   PHQ/GAD looks good Refilled celexa Lunesta refilled ativan already refilled  .Marland KitchenPDMP reviewed during this encounter.  Labs UTD Hyperthyroidism managed by endocrinology    Return in about 6 months (around 01/30/2024).    Tandy Gaw, PA-C

## 2023-09-11 ENCOUNTER — Ambulatory Visit: Payer: No Typology Code available for payment source | Admitting: Internal Medicine

## 2023-09-11 ENCOUNTER — Encounter: Payer: Self-pay | Admitting: Internal Medicine

## 2023-09-11 VITALS — BP 120/64 | HR 62 | Ht 68.0 in | Wt 147.0 lb

## 2023-09-11 DIAGNOSIS — E0590 Thyrotoxicosis, unspecified without thyrotoxic crisis or storm: Secondary | ICD-10-CM | POA: Diagnosis not present

## 2023-09-11 NOTE — Progress Notes (Signed)
Name: Betty Stuart  MRN/ DOB: [id number], 1961/05/09    Age/ Sex: 62 y.o., female    PCP: Jomarie Longs, PA-C   Reason for Endocrinology Evaluation: Subclinical Hyperthyroidism     Date of Initial Endocrinology Evaluation: 07/04/2021    HPI: [name] is a 62 y.o. female with a past medical history of GAD. The patient presented for initial endocrinology clinic visit on 07/04/2021 for consultative assistance with her Subclinical Hyperthyroidism.   She has been noted with suppressed TSH since 01/2021 with a nadir of 0.01 uIU/mL in 05/2021 with normal free T4 and T3.    On her initial visit to our clinic she was on methimazole 5 mg , started  in 05/2021  Methimazole has improved hand tremors    NO FH of thyroid disease   TRAB undetectable  Thyroid ultrasound 07/2021 showed sub-centimeter nodules    SUBJECTIVE:    Today (09/11/23):  Betty Stuart is here for follow-up on hyperthyroidism.  Weight has been trending up Denies eye symptoms Denies local neck symptoms Tremors with exhaustion  Palpitations have resolved  She has chronic alternating constipation and diarrhea  Methimazole 5 mg, 1 tab twice weekly ( Sunday and Wednesday)      HISTORY:  Past Medical History: No past medical history on file. Past Surgical History:  Past Surgical History:  Procedure Laterality Date   BUNIONECTOMY     CESAREAN SECTION     herniated disk      Social History:  reports that she has never smoked. She has never used smokeless tobacco. She reports current alcohol use of about 1.0 standard drink of alcohol per week. She reports that she does not use drugs. Family History: family history includes Alcohol abuse in her maternal uncle and paternal uncle.   HOME MEDICATIONS: Allergies as of 09/11/2023       Reactions   Hydrocodone Nausea And Vomiting   Remeron [mirtazapine]    Not effective.    Tramadol Nausea And Vomiting   Zoloft [sertraline Hcl] Rash         Medication List        Accurate as of September 11, 2023  8:58 AM. If you have any questions, ask your nurse or doctor.          citalopram 40 MG tablet Commonly known as: CELEXA Take 1 tablet (40 mg total) by mouth daily.   Eszopiclone 3 MG Tabs Take 1 tablet (3 mg total) by mouth at bedtime as needed.   ibuprofen 100 MG tablet Commonly known as: ADVIL Take 100 mg by mouth as needed for fever.   LORazepam 0.5 MG tablet Commonly known as: ATIVAN TAKE 1 TABLET BY MOUTH EVERY 8 HOURS AS NEEDED FOR ANXIETY   methimazole 5 MG tablet Commonly known as: TAPAZOLE Take 0.5 tablets (2.5 mg total) by mouth as directed. 1 tablet twice weekly   TYLENOL 8 HOUR PO Take by mouth as needed.   valACYclovir 1000 MG tablet Commonly known as: VALTREX Take 1,000 mg by mouth 2 (two) times daily.          REVIEW OF SYSTEMS: A comprehensive ROS was conducted with the patient and is negative except as per HPI     OBJECTIVE:  VS: BP 120/64 (BP Location: Left Arm, Patient Position: Sitting, Cuff Size: Small)   Pulse 62   Ht 5\' 8"  (1.727 m)   Wt 147 lb (66.7 kg)   LMP 06/07/2014   SpO2 98%   BMI  22.35 kg/m    Wt Readings from Last 3 Encounters:  09/11/23 147 lb (66.7 kg)  08/02/23 147 lb (66.7 kg)  03/12/23 145 lb (65.8 kg)     EXAM: General: Pt appears well and is in NAD  Eyes: External eye exam normal without stare, lid lag or exophthalmos.  EOM intact.  Neck: General: Supple without adenopathy. Thyroid: Thyroid size normal.  No goiter or nodules appreciated. No thyroid bruit.  Lungs: Clear with good BS bilat   Heart: Auscultation: RRR.  Abdomen: soft, nontender  Extremities:  BL LE: No pretibial edema normal ROM and strength.  Mental Status: Judgment, insight: Intact Orientation: Oriented to time, place, and person Mood and affect: No depression, anxiety, or agitation     DATA REVIEWED:   Latest Reference Range & Units 09/11/23 09:20  TSH 0.40 - 4.50 mIU/L  1.31  Triiodothyronine,Free,Serum 2.3 - 4.2 pg/mL 2.9  T4,Free(Direct) 0.8 - 1.8 ng/dL 1.1      Latest Reference Range & Units 07/04/21 10:42  TRAB <=2.00 IU/L <1.00    Thyroid ultrasound 07/13/2021  Estimated total number of nodules >/= 1 cm: 0   Number of spongiform nodules >/=  2 cm not described below (TR1): 0   Number of mixed cystic and solid nodules >/= 1.5 cm not described below (TR2): 0   _________________________________________________________   Minor thyroid heterogeneity. There are a few scattered subcentimeter benign cystic nodules noted, all measuring 5 mm or less in size. These would not meet criteria for any biopsy or follow-up. No other significant thyroid abnormality. No hypervascularity. No regional adenopathy.   IMPRESSION: Nonspecific minor thyroid heterogeneity and subcentimeter benign cystic nodules.     ASSESSMENT/PLAN/RECOMMENDATIONS:   Subclinical Hyperthyroidism:   - Pt is clinically euthyroid  - No local neck symptoms  -TRAb was undetectable, and her thyroid ultrasound showed subcentimeter nodules. -TFTs remain normal  Medications : Continue  methimazole 5 mg , 1 tablet twice weekly    F/U in 6 months  Signed electronically by: Lyndle Herrlich, MD  [facility] Endocrinology  [facility] [address] [place] [place], [place] [address] Phone: [phone] FAX: [phone]   CC: [facility] [address] [place] [address] Phone: [phone] Fax: [phone]   Return to Endocrinology clinic as below: Future Appointments  Date Time Provider Department Center  01/31/2024  8:50 AM Jomarie Longs, PA-C [facility] None

## 2023-09-12 LAB — T3, FREE: T3, Free: 2.9 pg/mL (ref 2.3–4.2)

## 2023-09-12 LAB — TSH: TSH: 1.31 m[IU]/L (ref 0.40–4.50)

## 2023-09-12 LAB — T4, FREE: Free T4: 1.1 ng/dL (ref 0.8–1.8)

## 2023-09-13 MED ORDER — METHIMAZOLE 5 MG PO TABS: 5.0000 mg | ORAL_TABLET | ORAL | 2 refills | Status: DC

## 2023-10-02 ENCOUNTER — Other Ambulatory Visit: Payer: Self-pay | Admitting: Physician Assistant

## 2023-10-02 DIAGNOSIS — F411 Generalized anxiety disorder: Secondary | ICD-10-CM

## 2023-10-02 DIAGNOSIS — F3340 Major depressive disorder, recurrent, in remission, unspecified: Secondary | ICD-10-CM

## 2023-12-29 ENCOUNTER — Other Ambulatory Visit: Payer: Self-pay | Admitting: Physician Assistant

## 2023-12-29 DIAGNOSIS — F3340 Major depressive disorder, recurrent, in remission, unspecified: Secondary | ICD-10-CM

## 2023-12-29 DIAGNOSIS — F411 Generalized anxiety disorder: Secondary | ICD-10-CM

## 2024-01-09 ENCOUNTER — Other Ambulatory Visit: Payer: Self-pay | Admitting: Physician Assistant

## 2024-01-09 DIAGNOSIS — F411 Generalized anxiety disorder: Secondary | ICD-10-CM

## 2024-01-09 DIAGNOSIS — F410 Panic disorder [episodic paroxysmal anxiety] without agoraphobia: Secondary | ICD-10-CM

## 2024-01-28 ENCOUNTER — Ambulatory Visit: Admitting: Physician Assistant

## 2024-01-31 ENCOUNTER — Ambulatory Visit: Payer: No Typology Code available for payment source | Admitting: Physician Assistant

## 2024-02-03 ENCOUNTER — Ambulatory Visit (INDEPENDENT_AMBULATORY_CARE_PROVIDER_SITE_OTHER): Admitting: Physician Assistant

## 2024-02-03 VITALS — BP 109/72 | HR 72 | Ht 68.0 in | Wt 147.0 lb

## 2024-02-03 DIAGNOSIS — Z780 Asymptomatic menopausal state: Secondary | ICD-10-CM

## 2024-02-03 DIAGNOSIS — F411 Generalized anxiety disorder: Secondary | ICD-10-CM

## 2024-02-03 DIAGNOSIS — Z79899 Other long term (current) drug therapy: Secondary | ICD-10-CM

## 2024-02-03 DIAGNOSIS — E0590 Thyrotoxicosis, unspecified without thyrotoxic crisis or storm: Secondary | ICD-10-CM

## 2024-02-03 DIAGNOSIS — F5101 Primary insomnia: Secondary | ICD-10-CM

## 2024-02-03 DIAGNOSIS — Z13220 Encounter for screening for lipoid disorders: Secondary | ICD-10-CM

## 2024-02-03 DIAGNOSIS — Z131 Encounter for screening for diabetes mellitus: Secondary | ICD-10-CM

## 2024-02-03 DIAGNOSIS — F3340 Major depressive disorder, recurrent, in remission, unspecified: Secondary | ICD-10-CM

## 2024-02-03 MED ORDER — ESZOPICLONE 3 MG PO TABS: 3.0000 mg | ORAL_TABLET | Freq: Every evening | ORAL | 1 refills | Status: DC | PRN

## 2024-02-03 NOTE — Progress Notes (Unsigned)
 Established Patient Office Visit  Subjective   Patient ID: Betty Stuart, female    DOB: 25-Dec-1960  Age: 62 y.o. MRN: [id number]  Chief Complaint  Patient presents with   Medical Management of Chronic Issues    HPI Pt is a 63 yo female with insomnia, hyperthyroidism, MDD, GAD who presents to the clinic for 6 month follow up.   Pt is doing well with mood. Her sleep continues to be an issue even with lunesta . She has tried other sleep medications and they did not work so she wants to stay with lunesta  for now. She is down to methimazole  twice a week and working with endocrinology.    ROS See HPI.    Objective:     BP 109/72   Pulse 72   Ht 5\' 8"  (1.727 m)   Wt 147 lb (66.7 kg)   LMP 06/07/2014   SpO2 99%   BMI 22.35 kg/m  BP Readings from Last 3 Encounters:  02/03/24 109/72  09/11/23 120/64  08/02/23 115/71   Wt Readings from Last 3 Encounters:  02/03/24 147 lb (66.7 kg)  09/11/23 147 lb (66.7 kg)  08/02/23 147 lb (66.7 kg)    ..    02/04/2024    6:39 AM 08/02/2023    8:32 AM 01/30/2023    9:49 AM 07/30/2022    8:40 AM 04/04/2022    7:50 AM  Depression screen PHQ 2/9  Decreased Interest 0 0 0 2 0  Down, Depressed, Hopeless 0 0 1 2 1   PHQ - 2 Score 0 0 1 4 1   Altered sleeping 0 1 2 2 2   Tired, decreased energy 0 1 1 2 1   Change in appetite 0 0 0 2 0  Feeling bad or failure about yourself  0 0 0 1 0  Trouble concentrating 0 1 1 2 1   Moving slowly or fidgety/restless 0 0 0 2 0  Suicidal thoughts 0 0 0 1 0  PHQ-9 Score 0 3 5 16 5   Difficult doing work/chores Not difficult at all Not difficult at all Not difficult at all Somewhat difficult Not difficult at all   ..    02/04/2024    6:39 AM 08/02/2023    8:32 AM 01/30/2023    9:49 AM 07/30/2022    8:40 AM  GAD 7 : Generalized Anxiety Score  Nervous, Anxious, on Edge 1 0 1 3  Control/stop worrying 1 0 1 3  Worry too much - different things 1 0 0 2  Trouble relaxing 0 1 0 2  Restless 0 0 0 1  Easily annoyed  or irritable 0 1 1 0  Afraid - awful might happen 0 0 0 2  Total GAD 7 Score 3 2 3 13   Anxiety Difficulty Not difficult at all Not difficult at all Not difficult at all Somewhat difficult      Physical Exam Constitutional:      Appearance: Normal appearance.  HENT:     Head: Normocephalic.  Cardiovascular:     Rate and Rhythm: Normal rate and regular rhythm.  Pulmonary:     Effort: Pulmonary effort is normal.     Breath sounds: Normal breath sounds.  Musculoskeletal:     Right lower leg: No edema.     Left lower leg: No edema.  Neurological:     General: No focal deficit present.     Mental Status: She is alert and oriented to person, place, and time.  Psychiatric:  Mood and Affect: Mood normal.      The 10-year ASCVD risk score (Arnett DK, et al., 2019) is: 1.5%    Assessment & Plan:  Betty Stuart was seen today for medical management of chronic issues.  Diagnoses and all orders for this visit:  Hyperthyroidism  Primary insomnia -     Eszopiclone  3 MG TABS; Take 1 tablet (3 mg total) by mouth at bedtime as needed.  Recurrent major depressive disorder, in remission (HCC) -     citalopram  (CELEXA ) 40 MG tablet; Take 1 tablet (40 mg total) by mouth daily.  GAD (generalized anxiety disorder) -     citalopram  (CELEXA ) 40 MG tablet; Take 1 tablet (40 mg total) by mouth daily.  Post-menopausal -     VITAMIN D 25 Hydroxy (Vit-D Deficiency, Fractures)  Medication management -     CMP14+EGFR -     CBC w/Diff/Platelet  Screening for diabetes mellitus -     CMP14+EGFR  Screening for lipid disorders -     Lipid panel   Per patient she states labs done at work and will get us  copy to abstract Vitals look great today PHQ/GAD no concerns Refilled lunesta  Pt continues to have some problem with sleep suggested adding melatonin and magnesium combination at bedtime with lunesta  Refilled celexa  Continue to follow up with endocrinology on hyperthyroidism Follow up in  6 months   Return in about 6 months (around 08/05/2024).    Zilpha Mcandrew, PA-C

## 2024-02-04 ENCOUNTER — Encounter: Payer: Self-pay | Admitting: Physician Assistant

## 2024-02-04 MED ORDER — CITALOPRAM HYDROBROMIDE 40 MG PO TABS: 40.0000 mg | ORAL_TABLET | Freq: Every day | ORAL | 3 refills | Status: DC

## 2024-02-06 ENCOUNTER — Other Ambulatory Visit: Payer: Self-pay

## 2024-02-06 DIAGNOSIS — F410 Panic disorder [episodic paroxysmal anxiety] without agoraphobia: Secondary | ICD-10-CM

## 2024-02-06 DIAGNOSIS — F411 Generalized anxiety disorder: Secondary | ICD-10-CM

## 2024-02-07 MED ORDER — LORAZEPAM 0.5 MG PO TABS: 0.5000 mg | ORAL_TABLET | Freq: Three times a day (TID) | ORAL | 5 refills | Status: DC | PRN

## 2024-03-04 ENCOUNTER — Ambulatory Visit: Admitting: Internal Medicine

## 2024-03-04 ENCOUNTER — Encounter: Payer: Self-pay | Admitting: Internal Medicine

## 2024-03-04 VITALS — BP 104/68 | HR 63 | Ht 68.0 in | Wt 146.0 lb

## 2024-03-04 DIAGNOSIS — E0590 Thyrotoxicosis, unspecified without thyrotoxic crisis or storm: Secondary | ICD-10-CM

## 2024-03-04 LAB — T3, FREE: T3, Free: 3 pg/mL (ref 2.3–4.2)

## 2024-03-04 LAB — T4, FREE: Free T4: 1.3 ng/dL (ref 0.8–1.8)

## 2024-03-04 LAB — TSH: TSH: 1.41 m[IU]/L (ref 0.40–4.50)

## 2024-03-04 NOTE — Progress Notes (Unsigned)
 Name: Betty Stuart  MRN/ DOB: [id number], 1961/03/21    Age/ Sex: 63 y.o., female    PCP: Araceli Knight, PA-C   Reason for Endocrinology Evaluation: Subclinical Hyperthyroidism     Date of Initial Endocrinology Evaluation: 07/04/2021    HPI: Betty Stuart is a 63 y.o. female with a past medical history of GAD. The patient presented for initial endocrinology clinic visit on 07/04/2021 for consultative assistance with her Subclinical Hyperthyroidism.   She has been noted with suppressed TSH since 01/2021 with a nadir of 0.01 uIU/mL in 05/2021 with normal free T4 and T3.    On her initial visit to our clinic she was on methimazole  5 mg , started  in 05/2021  Methimazole  has improved hand tremors    NO FH of thyroid  disease   TRAB undetectable  Thyroid  ultrasound 07/2021 showed sub-centimeter nodules    SUBJECTIVE:    Today (03/04/24):  Betty Stuart is here for follow-up on hyperthyroidism.  Weight has been stable over the past 6 months  Denies local neck swelling  Denies palpitations  She did have shakes over the past 3 weekends , that she attributes to ashwaganda  Denies eye symptoms   Continues with chronic alternating constipation and diarrhea  Methimazole  5 mg, 1 tab twice weekly ( Sunday and Wednesday)     HISTORY:  Past Medical History: No past medical history on file. Past Surgical History:  Past Surgical History:  Procedure Laterality Date   BUNIONECTOMY     CESAREAN SECTION     herniated disk      Social History:  reports that she has never smoked. She has never used smokeless tobacco. She reports current alcohol use of about 1.0 standard drink of alcohol per week. She reports that she does not use drugs. Family History: family history includes Alcohol abuse in her maternal uncle and paternal uncle.   HOME MEDICATIONS: Allergies as of 03/04/2024       Reactions   Hydrocodone  Nausea And Vomiting   Remeron  [mirtazapine ]    Not effective.     Tramadol Nausea And Vomiting   Zoloft [sertraline Hcl] Rash        Medication List        Accurate as of March 04, 2024  7:35 AM. If you have any questions, ask your nurse or doctor.          citalopram  40 MG tablet Commonly known as: CELEXA  Take 1 tablet (40 mg total) by mouth daily.   Eszopiclone  3 MG Tabs Take 1 tablet (3 mg total) by mouth at bedtime as needed.   ibuprofen 100 MG tablet Commonly known as: ADVIL Take 100 mg by mouth as needed for fever.   LORazepam  0.5 MG tablet Commonly known as: ATIVAN  Take 1 tablet (0.5 mg total) by mouth every 8 (eight) hours as needed. for anxiety   methimazole  5 MG tablet Commonly known as: TAPAZOLE  Take 1 tablet (5 mg total) by mouth as directed. 1 tablet twice weekly   TYLENOL 8 HOUR PO Take by mouth as needed.   valACYclovir 1000 MG tablet Commonly known as: VALTREX Take 1,000 mg by mouth 2 (two) times daily.          REVIEW OF SYSTEMS: A comprehensive ROS was conducted with the patient and is negative except as per HPI     OBJECTIVE:  VS: LMP 06/07/2014    Wt Readings from Last 3 Encounters:  02/03/24 147 lb (66.7 kg)  09/11/23  147 lb (66.7 kg)  08/02/23 147 lb (66.7 kg)     EXAM: General: Pt appears well and is in NAD  Eyes: External eye exam normal without stare, lid lag or exophthalmos.  EOM intact.  Neck: General: Supple without adenopathy. Thyroid : Thyroid  size normal.  No goiter or nodules appreciated. No thyroid  bruit.  Lungs: Clear with good BS bilat   Heart: Auscultation: RRR.  Abdomen: soft, nontender  Extremities:  BL LE: No pretibial edema normal ROM and strength.  Mental Status: Judgment, insight: Intact Orientation: Oriented to time, place, and person Mood and affect: No depression, anxiety, or agitation     DATA REVIEWED:   Latest Reference Range & Units 09/11/23 09:20  TSH 0.40 - 4.50 mIU/L 1.31  Triiodothyronine,Free,Serum 2.3 - 4.2 pg/mL 2.9  T4,Free(Direct) 0.8 - 1.8  ng/dL 1.1      Latest Reference Range & Units 07/04/21 10:42  TRAB <=2.00 IU/L <1.00    Thyroid  ultrasound 07/13/2021  Estimated total number of nodules >/= 1 cm: 0   Number of spongiform nodules >/=  2 cm not described below (TR1): 0   Number of mixed cystic and solid nodules >/= 1.5 cm not described below (TR2): 0   _________________________________________________________   Minor thyroid  heterogeneity. There are a few scattered subcentimeter benign cystic nodules noted, all measuring 5 mm or less in size. These would not meet criteria for any biopsy or follow-up. No other significant thyroid  abnormality. No hypervascularity. No regional adenopathy.   IMPRESSION: Nonspecific minor thyroid  heterogeneity and subcentimeter benign cystic nodules.     ASSESSMENT/PLAN/RECOMMENDATIONS:   Subclinical Hyperthyroidism:   - Pt is clinically euthyroid  - No local neck symptoms  -TRAb was undetectable, and her thyroid  ultrasound showed subcentimeter nodules. -TFTs remain normal  Medications : Continue  methimazole  5 mg , 1 tablet twice weekly    F/U in 6 months  Signed electronically by: Natale Bail, MD  [facility] Endocrinology  [facility] [address] [place] [place], [place] [address] Phone: [phone] FAX: [phone]   CC: [facility] [address] [place] [address] Phone: [phone] Fax: [phone]   Return to Endocrinology clinic as below: Future Appointments  Date Time Provider Department Center  03/04/2024 10:30 AM Fionnuala Hemmerich, Julian Obey, MD [facility] None  08/05/2024  8:10 AM Araceli Knight, PA-C [facility] None

## 2024-03-06 ENCOUNTER — Ambulatory Visit: Payer: Self-pay | Admitting: Internal Medicine

## 2024-03-06 MED ORDER — METHIMAZOLE 5 MG PO TABS: 5.0000 mg | ORAL_TABLET | ORAL | Status: DC

## 2024-03-11 ENCOUNTER — Ambulatory Visit: Payer: No Typology Code available for payment source | Admitting: Internal Medicine

## 2024-03-29 ENCOUNTER — Other Ambulatory Visit: Payer: Self-pay | Admitting: Physician Assistant

## 2024-03-29 DIAGNOSIS — F411 Generalized anxiety disorder: Secondary | ICD-10-CM

## 2024-03-29 DIAGNOSIS — F3340 Major depressive disorder, recurrent, in remission, unspecified: Secondary | ICD-10-CM

## 2024-04-05 ENCOUNTER — Encounter: Payer: Self-pay | Admitting: Physician Assistant

## 2024-06-22 ENCOUNTER — Other Ambulatory Visit: Payer: Self-pay

## 2024-06-22 ENCOUNTER — Ambulatory Visit: Payer: Self-pay

## 2024-06-22 MED ORDER — METHIMAZOLE 5 MG PO TABS: ORAL_TABLET | ORAL | 1 refills | Status: DC

## 2024-06-22 NOTE — Telephone Encounter (Signed)
 FYI Only or Action Required?: FYI only for provider.  Patient was last seen in primary care on 02/03/2024 by Antoniette Vermell CROME, PA-C.  Called Nurse Triage reporting Ear Fullness and Hand Pain.  Symptoms began a week ago.  Interventions attempted: Rest, hydration, or home remedies.  Symptoms are: gradually worsening.  Triage Disposition: See PCP When Office is Open (Within 3 Days)  Patient/caregiver understands and will follow disposition?: Yes      Copied from CRM [id number]. Topic: Clinical - Red Word Triage >> Jun 22, 2024  9:21 AM Susanna ORN wrote: Red Word that prompted transfer to Nurse Triage: Patient calling to schedule with Dr. Antoniette. States her left ear is plugging up but has no discharge or pain & she's also having pain in left hand. Rates it 7/10 for pain. Reason for Disposition  [1] MODERATE pain (e.g., interferes with normal activities) AND [2] present > 3 days  [1] Ear congestion lasts > 3 days AND [2] no improvement after using Care Advice  (Exception: Ear congestion is a chronic symptom.)  Answer Assessment - Initial Assessment Questions Denies drainage, cold and flu symptoms  1. LOCATION: Which ear is involved?       Left  2. SENSATION: Describe how the ear feels. (e.g., stuffy, full, plugged).      Patient states it is plugging up and causing decreased hearing 3. ONSET:  When did the ear symptoms start?       1.5 weeks, worsening over last few days 4. PAIN: Do you also have an earache? If Yes, ask: How bad is it? (Scale 0-10; none, mild, moderate or severe)     Denies pain 5. CAUSE: What do you think is causing the ear congestion? (e.g., common cold, nasal allergies, recent flight, recent snorkeling)     Patient states she has allergies 6. OTHER SYMPTOMS: Do you have any other symptoms? (e.g., ear drainage, hay fever symptoms such as sneezing or a clear nasal discharge; cold symptoms such as a cough or runny nose)     No  Answer Assessment -  Initial Assessment Questions 1. ONSET: When did the pain start?     July 5 2. LOCATION: Where is the pain located?     Left hand, mainly thumb and first finger 3. PAIN: How bad is the pain? (Scale 1-10; or mild, moderate, severe)     Patient reports it as staying sore and varies 5. CAUSE: What do you think is causing the pain?     Patient fell off of stool 6. AGGRAVATING FACTORS: What makes the pain worse? (e.g., using computer)     Patient states it gets sore with use, especially with using the hand for coordination such as writing or using scissors 7. OTHER SYMPTOMS: Do you have any other symptoms? (e.g., fever, neck pain, numbness or tingling, rash, swelling)     Intermittent tingling  Protocols used: Ear - Congestion-A-AH, Hand Pain-A-AH

## 2024-06-22 NOTE — Telephone Encounter (Signed)
 Appointment tomorrow

## 2024-06-23 ENCOUNTER — Ambulatory Visit (INDEPENDENT_AMBULATORY_CARE_PROVIDER_SITE_OTHER)

## 2024-06-23 ENCOUNTER — Ambulatory Visit (INDEPENDENT_AMBULATORY_CARE_PROVIDER_SITE_OTHER): Admitting: Physician Assistant

## 2024-06-23 ENCOUNTER — Encounter: Payer: Self-pay | Admitting: Physician Assistant

## 2024-06-23 VITALS — BP 107/64 | HR 63 | Temp 98.4°F | Ht 68.0 in | Wt 146.0 lb

## 2024-06-23 DIAGNOSIS — H6123 Impacted cerumen, bilateral: Secondary | ICD-10-CM | POA: Insufficient documentation

## 2024-06-23 DIAGNOSIS — M79642 Pain in left hand: Secondary | ICD-10-CM | POA: Diagnosis not present

## 2024-06-23 MED ORDER — DEBROX 6.5 % OT SOLN: 10.0000 [drp] | Freq: Two times a day (BID) | OTIC | 0 refills | Status: AC

## 2024-06-23 NOTE — Progress Notes (Unsigned)
   Acute Office Visit  Subjective:     Patient ID: Betty Stuart, female    DOB: 06-28-1961, 63 y.o.   MRN: [id number]  Chief Complaint  Patient presents with   Ear Fullness   Hand Pain    Left thumb    Ear Fullness   Hand Pain    Patient with PMH of allergic rhinitis is in today for ear fullness in her left ear as well as left hand pain after a fall back in July.   The ear fullness began a week and a half ago. No URI or allergy exacerbation that she can think of before the ear symptoms began. States it feels like that ear is under water. Some relief comes when she tugs and pulls on her ear. Denies pain, drainage, dizziness, headaches, or tinnitus.   The left hand pain has been persisting since she fell on July 5th. It was not a FOOSH. She was climbing something and fell directly on her left side with her hand and arm under her. She said her hand and forearm bruised almost immediately, and her elbow was scraped up. She tried managing it with BioFreeze, ibuprofen, tylenol, and a thumb spica splint. She never sought out medical care. Bruising has since resolved. There is no longer any pain in her forearm or elbow. All the pain is localized to the left thumb. She states it is stiff and she has trouble using a pen and scissors. It has made ADLs challenging.   ROS      Objective:    BP 107/64 (BP Location: Right Arm, Patient Position: Sitting, Cuff Size: Normal)   Pulse 63   Temp 98.4 F (36.9 C) (Oral)   Ht 5' 8 (1.727 m)   Wt 66.2 kg   LMP 06/07/2014   SpO2 100%   BMI 22.20 kg/m  {Vitals History (Optional):23777}  Physical Exam Constitutional:      General: She is not in acute distress. Musculoskeletal:     Left hand: Tenderness and bony tenderness present. No swelling.     Comments: Pain with full ROM, mostly with opposition. Positive snuffbox tenderness, and tender to palpation over 1st MCP.   Neurological:     Mental Status: She is alert.      No results found  for any visits on 06/23/24.      Assessment & Plan:   Problem List Items Addressed This Visit   None   No orders of the defined types were placed in this encounter.   No follow-ups on file.  [address] [place], Student-PA

## 2024-06-23 NOTE — Patient Instructions (Signed)
 Earwax Buildup, Adult Your ears make something called earwax. It helps keep germs called bacteria away and protects the skin in your ears. Sometimes, too much earwax can build up. This can cause discomfort or make it harder to hear. What are the causes? Earwax buildup can happen when you have too much earwax in your ears. Earwax is made in the outer part of your ear canal. It's supposed to fall out in small amounts over time. But if your ears aren't able to clean themselves like they should, earwax can build up. What increases the risk? You're more likely to get earwax buildup if: You clean your ears with cotton swabs. You pick at your ears. You use earplugs or in-ear headphones a lot. You wear hearing aids. You may also be more likely to get it if: You're female. You're older. Your ears naturally make more earwax. You have narrow ear canals or extra hair in your ears. Your earwax is too thick or sticky. You have eczema. You're dehydrated. This means there's not enough fluid in your body. What are the signs or symptoms? Symptoms of earwax buildup include: Not being able to hear as well. A feeling of fullness in your ear. Feeling like your ear is plugged. Fluid coming from your ear. Ear pain or an itchy ear. Ringing in your ear. Coughing or problems with balance. How is this diagnosed? Earwax buildup may be diagnosed based on your symptoms, medical history, and an ear exam. During the exam, your health care provider will look into your ear with a tool called an otoscope. You may also have tests, such as a hearing test. How is this treated? Earwax buildup may be treated by: Using ear drops. Having the earwax removed by a provider. The provider may: Flush the ear with water. Use a tool called a curette that has a loop on the end. Use a suction device. Having surgery. This may be done in severe cases. Follow these instructions at home:  Cleaning your ears Clean your ears as told  by your provider. You can clean the outside of your ears with a washcloth or tissue. Do not overclean your ears. Do not put anything into your ear unless told. This includes cotton swabs. General instructions Take over-the-counter and prescription medicines only as told by your provider. Drink enough fluid to keep your pee (urine) pale yellow. This helps thin the earwax. If you have hearing aids, clean them as told. Keep all follow-up visits. If earwax builds up in your ears often or if you use hearing aids, ask your provider how often you should have your ears cleaned. Contact a health care provider if: Your ear pain gets worse. You have a fever. You have pus, blood, or other fluid coming from your ear. You have hearing loss. You have ringing in your ears that won't go away. You feel like the room is spinning. This is called vertigo. Your symptoms don't get better with treatment. This information is not intended to replace advice given to you by your health care provider. Make sure you discuss any questions you have with your health care provider. Document Revised: 11/29/2022 Document Reviewed: 11/29/2022 Elsevier Patient Education  2024 [employer].

## 2024-06-24 ENCOUNTER — Ambulatory Visit: Payer: Self-pay | Admitting: Physician Assistant

## 2024-06-24 NOTE — Progress Notes (Signed)
 Xray of hand is negative. Would you be willing to start physical therapy?

## 2024-06-25 ENCOUNTER — Telehealth: Payer: Self-pay

## 2024-06-25 ENCOUNTER — Other Ambulatory Visit: Payer: Self-pay

## 2024-06-25 ENCOUNTER — Ambulatory Visit: Payer: Self-pay

## 2024-06-25 NOTE — Telephone Encounter (Signed)
 Copied from CRM [id number]. Topic: Clinical - Medical Advice >> Jun 25, 2024  8:41 AM Antwanette L wrote: Reason for CRM:  The patient is calling regarding her left ear, b/c it  as packed with earwax. She is requesting guidance on what she can use to relieve the blockage. Patient is requesting a callback at [phone]

## 2024-06-25 NOTE — Telephone Encounter (Signed)
 FYI Only or Action Required?: Action required by provider: would like to start physical therapy.  Patient was last seen in primary care on 06/23/2024 by Antoniette Vermell CROME, PA-C.  Called Nurse Triage reporting Results.  Symptoms began today.  Interventions attempted: Nothing.  Symptoms are: unchanged.  Triage Disposition: No disposition on file.  Patient/caregiver understands and will follow disposition?:       Copied from CRM #[id number]. Topic: Clinical - Red Word Triage >> Jun 25, 2024  8:39 AM Antwanette L wrote: Red Word that prompted transfer to Nurse Triage: Patient is having pain in her left hand. Patient had an xray done on 9/23.

## 2024-06-26 ENCOUNTER — Telehealth: Payer: Self-pay

## 2024-06-26 DIAGNOSIS — M79642 Pain in left hand: Secondary | ICD-10-CM | POA: Insufficient documentation

## 2024-06-26 NOTE — Telephone Encounter (Signed)
 Spoke with patient. Informed of provider recommendations.  She states she will pick up the Debrox and use this OTC -  She can not use any nasal sprays as they cause migraines and bloody noses.

## 2024-06-26 NOTE — Telephone Encounter (Signed)
 Xray showed no fractures. I referred you to hand physical therapy. Use voltaren gel and stay in thumb spica.

## 2024-06-26 NOTE — Telephone Encounter (Signed)
 It should not be able to be packed with ear wax since cleaned out on Wednesday. She can use debrox OTC 10 drops and let sit and then drain out. She can also use flonase to help with any ear pressure building up.

## 2024-06-26 NOTE — Progress Notes (Signed)
 Order placed

## 2024-06-26 NOTE — Telephone Encounter (Signed)
 Copied from CRM #[id number]. Topic: Clinical - Medical Advice >> Jun 26, 2024  1:30 PM Shanda MATSU wrote: Reason for CRM: Patient calling back in regards to call made to her, per notes in chart, patient was contacted to be provided medical advice, CAL did not answer, please call patient back.

## 2024-06-26 NOTE — Telephone Encounter (Signed)
 Attempted call to patient. Left a voce mail message requesting a return call.

## 2024-06-30 NOTE — Telephone Encounter (Signed)
 Patient states ears are fine for now - she had only wanted to know the name of medication she had recommended to use between visits for ear wax .

## 2024-07-01 NOTE — Therapy (Incomplete)
 OUTPATIENT OCCUPATIONAL THERAPY ORTHO EVALUATION  Patient Name: Betty Stuart MRN: [id number] DOB:19-May-1961, 63 y.o., female Today's Date: 07/02/2024  PCP: Antoniette Rasher PA-C REFERRING PROVIDER: Antoniette Rasher PA-C  END OF SESSION:  OT End of Session - 07/02/24 0941     Visit Number 1    Number of Visits 12    Date for Recertification  09/24/24    Authorization Type [employer]    Authorization Time Period 12 weeks    Authorization - Visit Number 1    Authorization - Number of Visits 20    OT Start Time 0840    OT Stop Time 0928    OT Time Calculation (min) 48 min    Activity Tolerance Patient tolerated treatment well    Behavior During Therapy [facility] for tasks assessed/performed          History reviewed. No pertinent past medical history. 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   History of cervical dysplasia 11/29/2015   Lumbar herniated disc 06/01/2015   Panic attack 12/01/2014   GAD (generalized anxiety disorder) 12/01/2014   Depression 12/01/2014   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   Insomnia 12/01/2014   Uterine fibroid 12/01/2014    ONSET DATE: 06/26/24  REFERRING DIAG: F20.357 (ICD-10-CM) - Left hand pain   THERAPY DIAG:  Left hand pain - Plan: Ot plan of care cert/re-cert  Localized edema - Plan: Ot plan of care cert/re-cert  Stiffness of left hand, not elsewhere classified - Plan: Ot plan of care cert/re-cert  Muscle weakness (generalized) - Plan: Ot plan of care cert/re-cert  Rationale for  Evaluation and Treatment: Rehabilitation  SUBJECTIVE:   SUBJECTIVE STATEMENT: Pt reports she fell off a stool and hurt hand Pt accompanied by: self  PERTINENT HISTORY: Pt s/p fall on left hand and arm July 5th with persistant pain, She has been wearing a thumb spica brace L hand x-ray 06/23/24 was negative for fx. see above for PMH  PRECAUTIONS: None  RED FLAGS: None   WEIGHT BEARING RESTRICTIONS: No  PAIN:  Are you having pain? Yes: NPRS scale: 1-4/10 Pain location: left thenar eminence Pain description: aching Aggravating factors: overuse Relieving factors: rest  FALLS: Has patient fallen in last 6 months? Yes. Number of falls 1, climbing on stool  LIVING ENVIRONMENT: Lives with: lives with their family and lives alone Lives in: House/apartment   PLOF:   PATIENT GOALS: improve pain and and ROM  NEXT MD VISIT: unknown  OBJECTIVE:  Note: Objective measures were completed at Evaluation unless otherwise noted.  HAND DOMINANCE: Left  ADLs: Overall ADLs: difficulty with donning lotion, and fastening belts  FUNCTIONAL OUTCOME MEASURES: Quick Dash: 30% impairment  UPPER EXTREMITY ROM:      Active ROM Right eval Left eval  Thumb MCP (0-60)  55  Thumb IP (0-80)  90  Thumb Radial abd/add (0-55)  10-40  Thumb Palmar abd/add (0-45)  10-60   Thumb Opposition to Small Finger  yes with difficulty   Index MCP (0-90)     Index  PIP (0-100)     Index DIP (0-70)      Long MCP (0-90)      Long PIP (0-100)      Long DIP (0-70)      Ring MCP (0-90)      Ring PIP (0-100)      Ring DIP (0-70)      Little MCP (0-90)      Little PIP (0-100)      Little DIP (0-70)      (Blank rows = not tested)    HAND FUNCTION: Grip strength: Right: 37 lbs; Left: 25 lbs, Lateral pinch: Right: 14 lbs, Left: 4 lbs, 3 point pinch: Right: 12 lbs, Left: 4 lbs, and Tip pinch: Right 10 lbs, Left: 6 lbs    SENSATION: WFL  EDEMA: mild at thenar eminence  COGNITION: Overall cognitive  status: Within functional limits for tasks assessed   OBSERVATIONS: Pleasant female motivated to improve thumb pain and ROM   TREATMENT DATE: 07/02/24- eval, Pt brought in her thumb pre-fab brace. It fits well. Pt was encouraged to wear brace with heavier tasks. See pt education                                                                                                                            Modalities: Ultrasound:  Time: 8 mins Location: L thumb and thernar eminence 3.3 w/cm2, 20% for pain and edema, no adverse reactions       PATIENT EDUCATION: Education details: role of OT, potential goals, inital HEP for AROM, massage to thumb and thenar eminence, use of ice/ heat, tensogrip provided. Person educated: Patient Education method: Explanation, Demonstration, Verbal cues, and Handouts Education comprehension: verbalized understanding, returned demonstration, and verbal cues required  HOME EXERCISE PROGRAM: 07/02/24- A/ROM  GOALS: Goals reviewed with patient? Yes  SHORT TERM GOALS: Target date: 08/01/24  I with inital HEP  Goal status: INITIAL  2.  I with splint wear scahedule, edema control and massage  Goal status: INITIAL  3.  Pt will increase L thumb radial abduction to 60*for increased functional use Baseline: 40 Goal status: INITIAL  4.  Pt will report LUE pain no greater than 3/10 for light functional use. Baseline:4/10 Goal status: INITIAL  5.  I with adapted strategies/ AE to improve LUE pain and maximize independence :  Goal status: INITIAL    LONG TERM GOALS: Target date: 09/24/24  I with updated HEP  Goal status: INITIAL  2.  Pt will increase L grip strength to 35 lbs or better for increased functional use. Baseline: R 37, L 25 Goal status: INITIAL  3.  Pt will increase L lateral and 3pt pinch to at least 10 lbs for increased functional use. Baseline: L lateral and 3pt pinch 4 lbs Goal status: INITIAL  4.  Pt will demonstrate  improved LUE functional sue as evidenced by improving Quick DASH score to 25% or better Baseline: 30% impairment Goal status: INITIAL  ASSESSMENT:  CLINICAL IMPRESSION: Patient is a 63 y.o. female who was seen today for occupational therapy evaluation for L hand pain. Pt had a fall on hand in July 2025. Pt presents with the performance deficits below. She can benefit from skilled occupational therapy to address these deficits in order to maximize pt's safety and I with ADLS/IADLs.  PERFORMANCE DEFICITS: in functional skills including ADLs, IADLs, coordination, dexterity, edema, ROM, strength, pain, flexibility, decreased knowledge of precautions, decreased knowledge of use of DME, and UE functional use,  and psychosocial skills including coping strategies, environmental adaptation, habits, interpersonal interactions, and routines and behaviors.   IMPAIRMENTS: are limiting patient from ADLs, IADLs, rest and sleep, work, play, leisure, and social participation.   COMORBIDITIES: may have co-morbidities  that affects occupational performance. Patient will benefit from skilled OT to address above impairments and improve overall function.  MODIFICATION OR ASSISTANCE TO COMPLETE EVALUATION: No modification of tasks or assist necessary to complete an evaluation.  OT OCCUPATIONAL PROFILE AND HISTORY: Detailed assessment: Review of records and additional review of physical, cognitive, psychosocial history related to current functional performance.  CLINICAL DECISION MAKING: LOW - limited treatment options, no task modification necessary  REHAB POTENTIAL: Good  EVALUATION COMPLEXITY: Low      PLAN:  OT FREQUENCY: 1x/week  OT DURATION: 12 weeks, anticipate d/c after 4-6 weeks  PLANNED INTERVENTIONS: 97168 OT Re-evaluation, 97535 self care/ADL training, 02889 therapeutic exercise, 97530 therapeutic activity, 97112 neuromuscular re-education, 97140 manual therapy, 97035 ultrasound, 97018  paraffin, 02989 moist heat, 97010 cryotherapy, 97034 contrast bath, 97014 electrical stimulation unattended, 97760 Orthotic Initial, 97763 Orthotic/Prosthetic subsequent, passive range of motion, energy conservation, coping strategies training, patient/family education, and DME and/or AE instructions  RECOMMENDED OTHER SERVICES: n/a  CONSULTED AND AGREED WITH PLAN OF CARE: Patient  PLAN FOR NEXT SESSION: review and progress HEP, US ,    Pascual Mantel, OT 07/02/2024, 10:06 AM

## 2024-07-02 ENCOUNTER — Ambulatory Visit: Attending: Physician Assistant | Admitting: Occupational Therapy

## 2024-07-02 ENCOUNTER — Encounter: Payer: Self-pay | Admitting: Occupational Therapy

## 2024-07-02 ENCOUNTER — Other Ambulatory Visit: Payer: Self-pay

## 2024-07-02 DIAGNOSIS — R600 Localized edema: Secondary | ICD-10-CM | POA: Insufficient documentation

## 2024-07-02 DIAGNOSIS — M79642 Pain in left hand: Secondary | ICD-10-CM | POA: Diagnosis present

## 2024-07-02 DIAGNOSIS — M6281 Muscle weakness (generalized): Secondary | ICD-10-CM | POA: Insufficient documentation

## 2024-07-02 DIAGNOSIS — M25642 Stiffness of left hand, not elsewhere classified: Secondary | ICD-10-CM | POA: Diagnosis present

## 2024-07-02 NOTE — Patient Instructions (Signed)
 Use heat for 8-10 mins prior to exercises Massage hand x 5 mins from thumb tip back towards wrist with lotion Ice after exercises if needed 5-8 mins                Opposition (Active)   Touch tip of thumb to nail tip of each finger in turn, making an O shape. Repeat __10__ times. Do _2__ sessions per day.   AROM: Thumb Abduction / Adduction    Actively pull right thumb away from palm as far as possible.  Then bring thumb back to touch fingers. Try not to bend fingers toward thumb. Repeat _10-15___ times per set.  Do __2_ sessions per day.    MP Flexion (Active)   Bend thumb to touch base of little finger, keeping tip joint straight. Repeat __10-15__ times. Do _2__ sessions per day    Composite Extension (Active)   Bring thumb up and out in hitchhiker position.  Repeat __10-15__ times. Do _2__ sessions per day.

## 2024-07-15 ENCOUNTER — Ambulatory Visit: Admitting: Occupational Therapy

## 2024-07-15 DIAGNOSIS — M79642 Pain in left hand: Secondary | ICD-10-CM

## 2024-07-15 DIAGNOSIS — M6281 Muscle weakness (generalized): Secondary | ICD-10-CM

## 2024-07-15 DIAGNOSIS — M25642 Stiffness of left hand, not elsewhere classified: Secondary | ICD-10-CM

## 2024-07-15 DIAGNOSIS — R600 Localized edema: Secondary | ICD-10-CM

## 2024-07-15 NOTE — Therapy (Unsigned)
 OUTPATIENT OCCUPATIONAL THERAPY ORTHO EVALUATION  Patient Name: Betty Stuart MRN: [id number] DOB:May 17, 1961, 63 y.o., female Today's Date: 07/16/2024  PCP: Antoniette Rasher PA-C REFERRING PROVIDER: Antoniette Rasher PA-C  END OF SESSION:  OT End of Session - 07/16/24 1454     Visit Number 2    Number of Visits 12    Date for Recertification  09/24/24    Authorization Type [employer]    Authorization Time Period 12 weeks    Authorization - Visit Number 2    Authorization - Number of Visits 20    OT Start Time 1534    OT Stop Time 1614    OT Time Calculation (min) 40 min           History reviewed. No pertinent past medical history. 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   History of cervical dysplasia 11/29/2015   Lumbar herniated disc 06/01/2015   Panic attack 12/01/2014   GAD (generalized anxiety disorder) 12/01/2014   Depression 12/01/2014   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 see above for PMH  PRECAUTIONS: None  RED FLAGS: None   WEIGHT BEARING RESTRICTIONS: No  PAIN:  Are you having pain? Yes: NPRS scale: 0-4/10, no pain at end of session Pain location: left thenar eminence Pain description: aching Aggravating factors: overuse Relieving factors: rest  FALLS: Has patient fallen in last 6 months? Yes. Number of falls 1, climbing on stool  LIVING ENVIRONMENT: Lives with: lives with their family and lives alone Lives in: House/apartment   PLOF:   PATIENT GOALS: improve pain and and ROM  NEXT MD VISIT: unknown  OBJECTIVE:  Note: Objective measures were completed at Evaluation unless otherwise noted.  HAND DOMINANCE: Left  ADLs: Overall ADLs: difficulty with donning lotion, and fastening belts  FUNCTIONAL OUTCOME MEASURES: Quick Dash: 30% impairment  UPPER EXTREMITY ROM:      Active ROM Right eval Left eval  Thumb MCP (0-60)  55  Thumb IP (0-80)  90  Thumb Radial abd/add (0-55)  10-40  Thumb Palmar abd/add (0-45)  10-60   Thumb Opposition to Small Finger  yes with difficulty   Index MCP (0-90)     Index PIP (0-100)     Index DIP (0-70)      Long MCP (0-90)      Long PIP (0-100)      Long DIP (0-70)      Ring MCP (0-90)  Ring PIP (0-100)      Ring DIP (0-70)      Little MCP (0-90)      Little PIP (0-100)      Little DIP (0-70)      (Blank rows = not tested)    HAND FUNCTION: Grip strength: Right: 37 lbs; Left: 25 lbs, Lateral pinch: Right: 14 lbs, Left: 4 lbs, 3 point pinch: Right: 12 lbs, Left: 4 lbs, and Tip pinch: Right 10 lbs, Left: 6 lbs    SENSATION: WFL  EDEMA: mild at thenar eminence  COGNITION: Overall cognitive status: Within functional limits for tasks assessed   OBSERVATIONS: Pleasant female motivated to improve thumb pain and ROM   TREATMENT DATE: 07/15/24-Pt arrived wearing her thumb brace. Modalities: Ultrasound: Time: 8 mins Location: L  thumb and thernar eminence 3.3 w/cm2, 20% for pain and edema, no adverse reactions Therapist perfromed massage to L thumb and thenar eminence  Reveiwed previously issued HEP 10 reps each, min v.c Thumb flexion with passive ulnar deviation stretch, x 5 reps  Pt was cautioned against heavy use of her L hand and to wear brace with heavier activities to minimize pain. Pt was cautioned against repetative gripping and pinching. Ice massage to L thumb and thenar eminence x 5 mins, no adverse reactions No pain at end of session.  07/02/24- eval, Pt brought in her thumb pre-fab brace. It fits well. Pt was encouraged to wear brace with heavier tasks. See pt education                                                                                                                            Modalities: Ultrasound:  Time: 8 mins Location: L thumb and thernar eminence 3.3 w/cm2, 20% for pain and edema, no adverse reactions       PATIENT EDUCATION: Education details: HEP review, improtance of avoiding overuse.  Person educated: Patient Education method: Explanation, Demonstration, Verbal cues, and Handouts Education comprehension: verbalized understanding, returned demonstration, and verbal cues required  HOME EXERCISE PROGRAM: 07/02/24- A/ROM  GOALS: Goals reviewed with patient? Yes  SHORT TERM GOALS: Target date: 08/01/24  I with inital HEP  Goal status: met, 07/15/24  2.  I with splint wear scahedule, edema control and massage  Goal status:ongoing, 07/15/24  3.  Pt will increase L thumb radial abduction to 60*for increased functional use Baseline: 40 Goal status: ongoing 07/15/24  4.  Pt will report LUE pain no greater than 3/10 for light functional use. Baseline:4/10 Goal status: ongoing 10/15/ 25  5.  I with adapted strategies/ AE to improve LUE pain and maximize independence :  Goal status: ongoing 07/15/24    LONG TERM GOALS: Target date: 09/24/24  I with updated  HEP  Goal status: INITIAL  2.  Pt will increase L grip strength to 35 lbs or better for increased functional use. Baseline: R 37, L 25 Goal status: INITIAL  3.  Pt will increase L lateral and  3pt pinch to at least 10 lbs for increased functional use. Baseline: L lateral and 3pt pinch 4 lbs Goal status: INITIAL  4.  Pt will demonstrate improved LUE functional sue as evidenced by improving Quick DASH score to 25% or better Baseline: 30% impairment Goal status: INITIAL    ASSESSMENT:  CLINICAL IMPRESSION: Patient is progressing towards goals. She demonstrates improved pain and ROM.  PERFORMANCE DEFICITS: in functional skills including ADLs, IADLs, coordination, dexterity, edema, ROM, strength, pain, flexibility, decreased knowledge of precautions, decreased knowledge of use of DME, and UE functional use,  and psychosocial skills including coping strategies, environmental adaptation, habits, interpersonal interactions, and routines and behaviors.   IMPAIRMENTS: are limiting patient from ADLs, IADLs, rest and sleep, work, play, leisure, and social participation.   COMORBIDITIES: may have co-morbidities  that affects occupational performance. Patient will benefit from skilled OT to address above impairments and improve overall function.  MODIFICATION OR ASSISTANCE TO COMPLETE EVALUATION: No modification of tasks or assist necessary to complete an evaluation.  OT OCCUPATIONAL PROFILE AND HISTORY: Detailed assessment: Review of records and additional review of physical, cognitive, psychosocial history related to current functional performance.  CLINICAL DECISION MAKING: LOW - limited treatment options, no task modification necessary  REHAB POTENTIAL: Good  EVALUATION COMPLEXITY: Low      PLAN:  OT FREQUENCY: 1x/week  OT DURATION: 12 weeks, anticipate d/c after 4-6 weeks  PLANNED INTERVENTIONS: 97168 OT Re-evaluation, 97535 self care/ADL training, 02889 therapeutic exercise,  97530 therapeutic activity, 97112 neuromuscular re-education, 97140 manual therapy, 97035 ultrasound, 97018 paraffin, 02989 moist heat, 97010 cryotherapy, 97034 contrast bath, 97014 electrical stimulation unattended, 97760 Orthotic Initial, 97763 Orthotic/Prosthetic subsequent, passive range of motion, energy conservation, coping strategies training, patient/family education, and DME and/or AE instructions  RECOMMENDED OTHER SERVICES: n/a  CONSULTED AND AGREED WITH PLAN OF CARE: Patient  PLAN FOR NEXT SESSION: review and progress HEP, US ,    Tamlyn Sides, OT 07/16/2024, 2:55 PM

## 2024-07-16 ENCOUNTER — Encounter: Payer: Self-pay | Admitting: Occupational Therapy

## 2024-07-21 ENCOUNTER — Ambulatory Visit: Admitting: Occupational Therapy

## 2024-07-21 ENCOUNTER — Encounter: Payer: Self-pay | Admitting: Occupational Therapy

## 2024-07-21 DIAGNOSIS — M6281 Muscle weakness (generalized): Secondary | ICD-10-CM

## 2024-07-21 DIAGNOSIS — R600 Localized edema: Secondary | ICD-10-CM

## 2024-07-21 DIAGNOSIS — M25642 Stiffness of left hand, not elsewhere classified: Secondary | ICD-10-CM

## 2024-07-21 DIAGNOSIS — M79642 Pain in left hand: Secondary | ICD-10-CM | POA: Diagnosis not present

## 2024-07-21 NOTE — Patient Instructions (Signed)
 Abduction (Passive Webspace Stretch)    Stretch thumb away from fingers, using opposite hand. Hold ___3-5_ seconds. Repeat _5-10___ times. Do _2___ sessions per day. Activity: Wrap thumb around coffee mug.*  Copyright  VHI. All rights reserved.     Putty-in-Your-Hand    Slowly squeeze putty or a soft rubber ball while breathing normally. Repeat with other hand. Repeat __10-20__ times. Do __1__ sessions per day.  [email or web address].us [email or web address]     Finger / Thumb Activities: Extension    Roll putty into rope shape using all fingers held straight. Hitchhike with thumb up and out.  Copyright  VHI. All rights reserved.   Pinch: Lateral    Squeeze putty between right thumb and side of each finger in turn. Make small balls and squash into coin shapes. Repeat _10-20___ times. Do ___1_ sessions per day. Activity: Hold dish of food.* Turn key in lock. Deal cards.  Copyright  VHI. All rights reserved.  Pinch: Palmar    Pinch putty with right thumb and each fingertip in turn. Repeat __10-20__ times. Do __1__ sessions per day. Activity: Peel fruit such as lemons or oranges.* Peel stickers off surfaces.  Copyright  VHI. All rights reserved.

## 2024-07-21 NOTE — Therapy (Signed)
 OUTPATIENT OCCUPATIONAL THERAPY ORTHO EVALUATION  Patient Name: Betty Stuart MRN: [id number] DOB:11-21-1960, 63 y.o., female Today's Date: 07/21/2024  PCP: Antoniette Rasher PA-C REFERRING PROVIDER: Antoniette Rasher PA-C  END OF SESSION:  OT End of Session - 07/21/24 1537     Visit Number 3    Number of Visits 12    Date for Recertification  09/24/24    Authorization Time Period 12 weeks    Authorization - Visit Number 3    Authorization - Number of Visits 20    OT Start Time 1534    OT Stop Time 1610    OT Time Calculation (min) 36 min    Activity Tolerance Patient tolerated treatment well    Behavior During Therapy [facility] for tasks assessed/performed           History reviewed. No pertinent past medical history. 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   History of cervical dysplasia 11/29/2015   Lumbar herniated disc 06/01/2015   Panic attack 12/01/2014   GAD (generalized anxiety disorder) 12/01/2014   Depression 12/01/2014   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 see above for PMH  PRECAUTIONS: None  RED FLAGS: None   WEIGHT BEARING RESTRICTIONS: No  PAIN:  Are you having pain? Yes: NPRS scale: 1/10, Pain location: left thenar eminence Pain description: aching Aggravating factors: overuse Relieving factors: rest  FALLS: Has patient fallen in last 6 months? Yes. Number of falls 1, climbing on stool  LIVING ENVIRONMENT: Lives with: lives with their family and lives alone Lives in: House/apartment   PLOF:   PATIENT GOALS: improve pain and and ROM  NEXT MD VISIT: unknown  OBJECTIVE:  Note: Objective measures were completed at Evaluation unless otherwise noted.  HAND DOMINANCE: Left  ADLs: Overall ADLs: difficulty with donning lotion, and fastening belts  FUNCTIONAL OUTCOME MEASURES: Quick Dash: 30% impairment  UPPER EXTREMITY ROM:      Active ROM Right eval Left eval  Thumb MCP (0-60)  55  Thumb IP (0-80)  90  Thumb Radial abd/add (0-55)  10-40  Thumb Palmar abd/add (0-45)  10-60   Thumb Opposition to Small Finger  yes with difficulty   Index MCP (0-90)     Index PIP (0-100)     Index DIP (0-70)      Long MCP (0-90)      Long PIP (0-100)      Long DIP (0-70)  Ring MCP (0-90)      Ring PIP (0-100)      Ring DIP (0-70)      Little MCP (0-90)      Little PIP (0-100)      Little DIP (0-70)      (Blank rows = not tested)    HAND FUNCTION: Grip strength: Right: 37 lbs; Left: 25 lbs, Lateral pinch: Right: 14 lbs, Left: 4 lbs, 3 point pinch: Right: 12 lbs, Left: 4 lbs, and Tip pinch: Right 10 lbs, Left: 6 lbs    SENSATION: WFL  EDEMA: mild at thenar eminence  COGNITION: Overall cognitive status: Within functional limits for tasks assessed   OBSERVATIONS: Pleasant female motivated to improve thumb pain and ROM   TREATMENT DATE: 07/21/24 Paraffin to LUE x 10 mins for stiffness  and pain, no adverse reactions. A/ROM, thumb flexion/ extension, opposition 10 reps Thumb flexion with passive ulnar deviation stretch, x 5 reps  Passive stretch in thumb radial abduction x 5 reps Yellow putty exercises for sustained grip, tip pinch and lateral pinch, min v.c  Prayer stretch x 10 reps Discussed avoiding overuse of thumb and pt to stop putty exercises if pain increases.   07/15/24-Pt arrived wearing her thumb brace. Modalities: Ultrasound: Time: 8 mins Location: L thumb and thernar eminence 3.3 w/cm2, 20% for pain and edema, no adverse reactions Therapist perfromed massage to L thumb and thenar eminence  Reveiwed previously issued HEP 10 reps each, min v.c Thumb flexion with passive ulnar deviation stretch, x 5 reps  Pt was cautioned against heavy use of her L hand and to wear brace with heavier activities to minimize pain. Pt was cautioned against repetative gripping and pinching. Ice massage to L thumb and thenar eminence x 5 mins, no adverse reactions No pain at end of session.  07/02/24- eval, Pt brought in her thumb pre-fab brace. It fits well. Pt was encouraged to wear brace with heavier tasks. See pt education                                                                                                                            Modalities: Ultrasound:  Time: 8 mins Location: L thumb and thernar eminence 3.3 w/cm2, 20% for pain and edema, no adverse reactions       PATIENT EDUCATION: Education details: yellow putty HEP  Person educated: Patient Education method: Programmer, multimedia, Demonstration, Verbal cues, and Handouts Education comprehension: verbalized understanding, returned demonstration, and verbal cues required  HOME EXERCISE PROGRAM: 07/02/24- A/ROM  GOALS: Goals reviewed with patient? Yes  SHORT TERM GOALS: Target date: 08/01/24  I with inital HEP  Goal status: met, 07/15/24  2.  I with splint wear schedule, edema control and  massage  Goal status:met, 07/21/24  3.  Pt will increase L thumb radial abduction to 60*for increased functional use Baseline: 40 Goal status: met 60*  4.  Pt will report LUE pain no greater than 3/10 for light functional  use. Baseline:4/10 Goal status: met 3/10 pain at most 07/21/24  5.  I with adapted strategies/ AE to improve LUE pain and maximize independence :  Goal status: ongoing 07/21/24    LONG TERM GOALS: Target date: 09/24/24  I with updated HEP  Goal status: INITIAL  2.  Pt will increase L grip strength to 35 lbs or better for increased functional use. Baseline: R 37, L 25 Goal status: INITIAL  3.  Pt will increase L lateral and 3pt pinch to at least 10 lbs for increased functional use. Baseline: L lateral and 3pt pinch 4 lbs Goal status: INITIAL  4.  Pt will demonstrate improved LUE functional sue as evidenced by improving Quick DASH score to 25% or better Baseline: 30% impairment Goal status: INITIAL    ASSESSMENT:  CLINICAL IMPRESSION: Patient is progressing towards goals. She demonstrates improved pain and ROM. Sh has achieved 4/5 short term goals.  PERFORMANCE DEFICITS: in functional skills including ADLs, IADLs, coordination, dexterity, edema, ROM, strength, pain, flexibility, decreased knowledge of precautions, decreased knowledge of use of DME, and UE functional use,  and psychosocial skills including coping strategies, environmental adaptation, habits, interpersonal interactions, and routines and behaviors.   IMPAIRMENTS: are limiting patient from ADLs, IADLs, rest and sleep, work, play, leisure, and social participation.   COMORBIDITIES: may have co-morbidities  that affects occupational performance. Patient will benefit from skilled OT to address above impairments and improve overall function.  MODIFICATION OR ASSISTANCE TO COMPLETE EVALUATION: No modification of tasks or assist necessary to complete an evaluation.  OT OCCUPATIONAL PROFILE AND  HISTORY: Detailed assessment: Review of records and additional review of physical, cognitive, psychosocial history related to current functional performance.  CLINICAL DECISION MAKING: LOW - limited treatment options, no task modification necessary  REHAB POTENTIAL: Good  EVALUATION COMPLEXITY: Low      PLAN:  OT FREQUENCY: 1x/week  OT DURATION: 12 weeks, anticipate d/c after 4-6 weeks  PLANNED INTERVENTIONS: 97168 OT Re-evaluation, 97535 self care/ADL training, 02889 therapeutic exercise, 97530 therapeutic activity, 97112 neuromuscular re-education, 97140 manual therapy, 97035 ultrasound, 97018 paraffin, 02989 moist heat, 97010 cryotherapy, 97034 contrast bath, 97014 electrical stimulation unattended, 97760 Orthotic Initial, 97763 Orthotic/Prosthetic subsequent, passive range of motion, energy conservation, coping strategies training, patient/family education, and DME and/or AE instructions  RECOMMENDED OTHER SERVICES: n/a  CONSULTED AND AGREED WITH PLAN OF CARE: Patient  PLAN FOR NEXT SESSION:  paraffin vs US , check on putty exercises.   Tadashi Burkel, OT 07/21/2024, 3:39 PM

## 2024-07-27 ENCOUNTER — Ambulatory Visit: Admitting: Occupational Therapy

## 2024-07-27 DIAGNOSIS — R600 Localized edema: Secondary | ICD-10-CM

## 2024-07-27 DIAGNOSIS — M79642 Pain in left hand: Secondary | ICD-10-CM | POA: Diagnosis not present

## 2024-07-27 DIAGNOSIS — M25642 Stiffness of left hand, not elsewhere classified: Secondary | ICD-10-CM

## 2024-07-27 DIAGNOSIS — M6281 Muscle weakness (generalized): Secondary | ICD-10-CM

## 2024-07-27 NOTE — Therapy (Signed)
 OUTPATIENT OCCUPATIONAL THERAPY ORTHO EVALUATION  Patient Name: Betty Stuart MRN: [id number] DOB:09-Oct-1960, 63 y.o., female Today's Date: 07/27/2024  PCP: Antoniette Rasher PA-C REFERRING PROVIDER: Antoniette Rasher PA-C  END OF SESSION:  OT End of Session - 07/27/24 1626     Visit Number 4    Number of Visits 12    Date for Recertification  09/24/24    Authorization Time Period 12 weeks    Authorization - Visit Number 4    Authorization - Number of Visits 20    OT Start Time 1534    OT Stop Time 1615    OT Time Calculation (min) 41 min    Activity Tolerance Patient tolerated treatment well    Behavior During Therapy [facility] for tasks assessed/performed            No past medical history on file. 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   History of cervical dysplasia 11/29/2015   Lumbar herniated disc 06/01/2015   Panic attack 12/01/2014   GAD (generalized anxiety disorder) 12/01/2014   Depression 12/01/2014   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 see above for PMH  PRECAUTIONS: None  RED FLAGS: None   WEIGHT BEARING RESTRICTIONS: No  PAIN:  Are you having pain? Yes: NPRS scale: 1/10, Pain location: left thenar eminence Pain description: aching Aggravating factors: overuse Relieving factors: rest  FALLS: Has patient fallen in last 6 months? Yes. Number of falls 1, climbing on stool  LIVING ENVIRONMENT: Lives with: lives with their family and lives alone Lives in: House/apartment   PLOF:   PATIENT GOALS: improve pain and and ROM  NEXT MD VISIT: unknown  OBJECTIVE:  Note: Objective measures were completed at Evaluation unless otherwise noted.  HAND DOMINANCE: Left  ADLs: Overall ADLs: difficulty with donning lotion, and fastening belts  FUNCTIONAL OUTCOME MEASURES: Quick Dash: 30% impairment  UPPER EXTREMITY ROM:      Active ROM Right eval Left eval  Thumb MCP (0-60)  55  Thumb IP (0-80)  90  Thumb Radial abd/add (0-55)  10-40  Thumb Palmar abd/add (0-45)  10-60   Thumb Opposition to Small Finger  yes with difficulty   Index MCP (0-90)     Index PIP (0-100)     Index DIP (0-70)      Long MCP (0-90)      Long PIP (0-100)      Long DIP (0-70)  Ring MCP (0-90)      Ring PIP (0-100)      Ring DIP (0-70)      Little MCP (0-90)      Little PIP (0-100)      Little DIP (0-70)      (Blank rows = not tested)    HAND FUNCTION: Grip strength: Right: 37 lbs; Left: 25 lbs, Lateral pinch: Right: 14 lbs, Left: 4 lbs, 3 point pinch: Right: 12 lbs, Left: 4 lbs, and Tip pinch: Right 10 lbs, Left: 6 lbs    SENSATION: WFL  EDEMA: mild at thenar eminence  COGNITION: Overall cognitive status: Within functional limits for tasks assessed   OBSERVATIONS: Pleasant female motivated to improve thumb pain and ROM   TREATMENT DATE: 07/27/24 US  3.3 mhz,0.8 w/cm2, 20% x8 mins no adverse  reactions A/ROM, thumb flexion/ extension, opposition 10 reps Thumb flexion with passive ulnar deviation stretch, x 5 reps  Passive stretch in thumb radial abduction x 5 reps Velcro board for key grip and large key grip 4 reps each Grip strength LUE 43 lbs Red flexbar for grip with rotation each direction x10 reps Gripper set at level 1 to pick up blocks, pt was able to pick up 7 blocks prior to fatigue. Graded clothespins 1-8# for sustained pinch with LUE, min difficulty Paraffin at end of session for pain and stiffness  x8 mins, no adverse reactions.  07/21/24 Paraffin to LUE x 10 mins for stiffness and pain, no adverse reactions. A/ROM, thumb flexion/ extension, opposition 10 reps Thumb flexion with passive ulnar deviation stretch, x 5 reps  Passive stretch in thumb radial abduction x 5 reps Yellow putty exercises for sustained grip, tip pinch and lateral pinch, min v.c  Prayer stretch x 10 reps Discussed avoiding overuse of thumb and pt to stop putty exercises if pain increases.   07/15/24-Pt arrived wearing her thumb brace. Modalities: Ultrasound: Time: 8 mins Location: L thumb and thernar eminence 3.3 w/cm2, 20% for pain and edema, no adverse reactions Therapist perfromed massage to L thumb and thenar eminence  Reveiwed previously issued HEP 10 reps each, min v.c Thumb flexion with passive ulnar deviation stretch, x 5 reps  Pt was cautioned against heavy use of her L hand and to wear brace with heavier activities to minimize pain. Pt was cautioned against repetative gripping and pinching. Ice massage to L thumb and thenar eminence x 5 mins, no adverse reactions No pain at end of session.  07/02/24- eval, Pt brought in her thumb pre-fab brace. It fits well. Pt was encouraged to wear brace with heavier tasks. See pt education                                                                                                                            Modalities: Ultrasound:  Time:  8 mins Location: L thumb and thernar eminence 3.3 w/cm2, 20% for pain and edema, no adverse reactions  PATIENT EDUCATION: Education details: see above  Person educated: Patient Education method: verbal cues , demonsrtation  Education comprehension: verbalized understanding, returned demonstration, and verbal cues required  HOME EXERCISE PROGRAM: 07/02/24- A/ROM  GOALS: Goals reviewed with patient? Yes  SHORT TERM GOALS: Target date: 08/01/24  I with inital HEP  Goal status: met, 07/15/24  2.  I with splint wear schedule, edema control and massage  Goal status:met, 07/21/24  3.  Pt will increase L thumb radial abduction to 60*for increased functional use Baseline: 40 Goal status: met 60*  4.  Pt will report LUE pain no greater than 3/10 for light functional use. Baseline:4/10 Goal status: met 3/10 pain at most 07/21/24  5.  I with adapted strategies/ AE to improve LUE pain and maximize independence :  Goal status: ongoing 07/21/24    LONG TERM GOALS: Target date: 09/24/24  I with updated HEP  Goal status: INITIAL  2.  Pt will increase L grip strength to 35 lbs or better for increased functional use. Baseline: R 37, L 25 Goal status: met, 43 lbs 07/27/24  3.  Pt will increase L lateral and 3pt pinch to at least 10 lbs for increased functional use. Baseline: L lateral and 3pt pinch 4 lbs Goal status: INITIAL  4.  Pt will demonstrate improved LUE functional sue as evidenced by improving Quick DASH score to 25% or better Baseline: 30% impairment Goal status: INITIAL    ASSESSMENT:  CLINICAL IMPRESSION: Patient is progressing towards goals. Pt demonstrates improved strength and ROM. Pt reports she was able to use scissors without increaed pain over the weekend.  PERFORMANCE DEFICITS: in functional skills including ADLs, IADLs, coordination, dexterity, edema, ROM, strength, pain, flexibility, decreased knowledge of precautions, decreased knowledge of  use of DME, and UE functional use,  and psychosocial skills including coping strategies, environmental adaptation, habits, interpersonal interactions, and routines and behaviors.   IMPAIRMENTS: are limiting patient from ADLs, IADLs, rest and sleep, work, play, leisure, and social participation.   COMORBIDITIES: may have co-morbidities  that affects occupational performance. Patient will benefit from skilled OT to address above impairments and improve overall function.  MODIFICATION OR ASSISTANCE TO COMPLETE EVALUATION: No modification of tasks or assist necessary to complete an evaluation.  OT OCCUPATIONAL PROFILE AND HISTORY: Detailed assessment: Review of records and additional review of physical, cognitive, psychosocial history related to current functional performance.  CLINICAL DECISION MAKING: LOW - limited treatment options, no task modification necessary  REHAB POTENTIAL: Good  EVALUATION COMPLEXITY: Low      PLAN:  OT FREQUENCY: 1x/week  OT DURATION: 12 weeks, anticipate d/c after 4-6 weeks  PLANNED INTERVENTIONS: 97168 OT Re-evaluation, 97535 self care/ADL training, 02889 therapeutic exercise, 97530 therapeutic activity, 97112 neuromuscular re-education, 97140 manual therapy, 97035 ultrasound, 97018 paraffin, 02989 moist heat, 97010 cryotherapy, 97034 contrast bath, 97014 electrical stimulation unattended, 97760 Orthotic Initial, 97763 Orthotic/Prosthetic subsequent, passive range of motion, energy conservation, coping strategies training, patient/family education, and DME and/or AE instructions  RECOMMENDED OTHER SERVICES: n/a  CONSULTED AND AGREED WITH PLAN OF CARE: Patient  PLAN FOR NEXT SESSION:  paraffin vs US , check on putty exercises.   Lakota Schweppe, OT 07/27/2024, 4:26 PM

## 2024-08-03 ENCOUNTER — Ambulatory Visit: Attending: Physician Assistant | Admitting: Occupational Therapy

## 2024-08-03 ENCOUNTER — Encounter: Payer: Self-pay | Admitting: Occupational Therapy

## 2024-08-03 DIAGNOSIS — M6281 Muscle weakness (generalized): Secondary | ICD-10-CM | POA: Diagnosis present

## 2024-08-03 DIAGNOSIS — M25642 Stiffness of left hand, not elsewhere classified: Secondary | ICD-10-CM | POA: Diagnosis present

## 2024-08-03 DIAGNOSIS — M79642 Pain in left hand: Secondary | ICD-10-CM | POA: Diagnosis present

## 2024-08-03 DIAGNOSIS — R600 Localized edema: Secondary | ICD-10-CM | POA: Diagnosis present

## 2024-08-03 NOTE — Therapy (Signed)
 OUTPATIENT OCCUPATIONAL THERAPY ORTHO EVALUATION  Patient Name: Betty Stuart MRN: [id number] DOB:August 03, 1961, 63 y.o., female Today's Date: 08/03/2024  PCP: Antoniette Rasher PA-C REFERRING PROVIDER: Antoniette Rasher PA-C  END OF SESSION:  OT End of Session - 08/03/24 1523     Visit Number 5    Number of Visits 12    Date for Recertification  09/24/24    Authorization Type [employer]    Authorization Time Period 12 weeks    Authorization - Visit Number 5    Authorization - Number of Visits 20    OT Start Time 1525    OT Stop Time 1610    OT Time Calculation (min) 45 min    Activity Tolerance Patient tolerated treatment well    Behavior During Therapy [facility] for tasks assessed/performed            History reviewed. No pertinent past medical history. 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   History of cervical dysplasia 11/29/2015   Lumbar herniated disc 06/01/2015   Panic attack 12/01/2014   GAD (generalized anxiety disorder) 12/01/2014   Depression 12/01/2014   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 see above for PMH  PRECAUTIONS: None  RED FLAGS: None   WEIGHT BEARING RESTRICTIONS: No  PAIN:  Are you having pain? Yes: NPRS scale: 1-2/10, Pain location: left thenar eminence Pain description: aching Aggravating factors: overuse Relieving factors: rest  FALLS: Has patient fallen in last 6 months? Yes. Number of falls 1, climbing on stool  LIVING ENVIRONMENT: Lives with: lives with their family and lives alone Lives in: House/apartment   PLOF:   PATIENT GOALS: improve pain and and ROM  NEXT MD VISIT: unknown  OBJECTIVE:  Note: Objective measures were completed at Evaluation unless otherwise noted.  HAND DOMINANCE: Left  ADLs: Overall ADLs: difficulty with donning lotion, and fastening belts  FUNCTIONAL OUTCOME MEASURES: Quick Dash: 30% impairment  UPPER EXTREMITY ROM:      Active ROM Right eval Left eval  Thumb MCP (0-60)  55  Thumb IP (0-80)  90  Thumb Radial abd/add (0-55)  10-40  Thumb Palmar abd/add (0-45)  10-60   Thumb Opposition to Small Finger  yes with difficulty   Index MCP (0-90)     Index PIP (0-100)     Index DIP (0-70)      Long MCP (0-90)      Long PIP (0-100)  Long DIP (0-70)      Ring MCP (0-90)      Ring PIP (0-100)      Ring DIP (0-70)      Little MCP (0-90)      Little PIP (0-100)      Little DIP (0-70)      (Blank rows = not tested)    HAND FUNCTION: Grip strength: Right: 37 lbs; Left: 25 lbs, Lateral pinch: Right: 14 lbs, Left: 4 lbs, 3 point pinch: Right: 12 lbs, Left: 4 lbs, and Tip pinch: Right 10 lbs, Left: 6 lbs    SENSATION: WFL  EDEMA: mild at thenar eminence  COGNITION: Overall cognitive status: Within functional limits for tasks assessed   OBSERVATIONS: Pleasant female motivated to improve thumb pain and ROM   TREATMENT DATE:  US  3.3 mhz,0.8 w/cm2, 20% x8 mins no adverse reactions A/ROM, thumb flexion/ extension, opposition 10 reps Thumb flexion with passive ulnar deviation stretch, x 5 reps  Passive stretch in thumb radial abduction x 5 reps Pain with passive wrist radial deviation, so stretch was discontinues. Red putt was issued and pt performed composite grip and tip pinch, pain with rolling out so pt transitioned to yeelow putty. Pt to use yellow putty this week. Pt was fitted witha thumb and wrist brace to wear during yard work or heavier activities as pt mowed and used leaf blower without her brace this weekend. Pt was educated in wear and precautions.   07/27/24 US  3.3 mhz,0.8 w/cm2, 20% x8 mins no adverse reactions A/ROM, thumb flexion/ extension, opposition 10 reps Thumb flexion with passive ulnar deviation stretch, x 5 reps  Passive stretch in thumb radial abduction x 5 reps Velcro board for key grip and large key grip 4 reps each Grip strength LUE 43 lbs Red flexbar for grip with rotation each direction x10 reps Gripper set at level 1 to pick up blocks, pt was able to pick up 7 blocks prior to fatigue. Graded clothespins 1-8# for sustained pinch with LUE, min difficulty Paraffin at end of session for pain and stiffness  x8 mins, no adverse reactions.  07/21/24 Paraffin to LUE x 10 mins for stiffness and pain, no adverse reactions. A/ROM, thumb flexion/ extension, opposition 10 reps Thumb flexion with passive ulnar deviation stretch, x 5 reps  Passive stretch in thumb radial abduction x 5 reps Yellow putty exercises for sustained grip, tip pinch and lateral pinch, min v.c  Prayer stretch x 10 reps Discussed avoiding overuse of thumb and pt to stop putty exercises if pain increases.   07/15/24-Pt arrived wearing her thumb brace. Modalities: Ultrasound: Time: 8 mins Location: L thumb and thernar eminence 3.3 w/cm2, 20% for pain and edema, no adverse reactions Therapist perfromed massage to L  thumb and thenar eminence  Reveiwed previously issued HEP 10 reps each, min v.c Thumb flexion with passive ulnar deviation stretch, x 5 reps  Pt was cautioned against heavy use of her L hand and to wear brace with heavier activities to minimize pain. Pt was cautioned against repetative gripping and pinching. Ice massage to L thumb and thenar eminence x 5 mins, no adverse reactions No pain at end of session.  07/02/24- eval, Pt brought in her thumb pre-fab brace. It fits well. Pt was encouraged to wear brace with heavier tasks. See pt education  Modalities: Ultrasound:  Time: 8 mins Location: L thumb and thernar eminence 3.3 w/cm2, 20% for pain and edema, no adverse reactions       PATIENT EDUCATION: Education details: custom splint wear and precautions  Person educated: Patient Education method: verbal cues , demonstration  Education comprehension: verbalized understanding, returned demonstration, and verbal cues required  HOME EXERCISE PROGRAM: 07/02/24- A/ROM  GOALS: Goals reviewed with patient? Yes  SHORT TERM GOALS: Target date: 08/01/24  I with inital HEP  Goal status: met, 07/15/24  2.  I with splint wear schedule, edema control and massage  Goal status:met, 07/21/24  3.  Pt will increase L thumb radial abduction to 60*for increased functional use Baseline: 40 Goal status: met 60*  4.  Pt will report LUE pain no greater than 3/10 for light functional use. Baseline:4/10 Goal status: met 3/10 pain at most 07/21/24  5.  I with adapted strategies/ AE to improve LUE pain and maximize independence :  Goal status: ongoing 07/21/24    LONG TERM GOALS: Target date: 09/24/24  I with updated HEP  Goal status: ongoing 08/03/24  2.  Pt will increase L grip strength to 35 lbs or better for increased functional use. Baseline: R 37, L 25 Goal  status: met, 43 lbs 07/27/24  3.  Pt will increase L lateral and 3pt pinch to at least 10 lbs for increased functional use. Baseline: L lateral and 3pt pinch 4 lbs Goal status: met 12 lbs for both 08/03/24  4.  Pt will demonstrate improved LUE functional sue as evidenced by improving Quick DASH score to 25% or better Baseline: 30% impairment Goal status: ongoing 08/03/24    ASSESSMENT:  CLINICAL IMPRESSION: Patient is progressing towards goals. Pt demonstrates improved strength and ROM. Pt reports overusing her hand this weekend. Pt was fitted with a custom thumb and wrist brace to wear during yardwork or heavier activities. PERFORMANCE DEFICITS: in functional skills including ADLs, IADLs, coordination, dexterity, edema, ROM, strength, pain, flexibility, decreased knowledge of precautions, decreased knowledge of use of DME, and UE functional use,  and psychosocial skills including coping strategies, environmental adaptation, habits, interpersonal interactions, and routines and behaviors.   IMPAIRMENTS: are limiting patient from ADLs, IADLs, rest and sleep, work, play, leisure, and social participation.   COMORBIDITIES: may have co-morbidities  that affects occupational performance. Patient will benefit from skilled OT to address above impairments and improve overall function.  MODIFICATION OR ASSISTANCE TO COMPLETE EVALUATION: No modification of tasks or assist necessary to complete an evaluation.  OT OCCUPATIONAL PROFILE AND HISTORY: Detailed assessment: Review of records and additional review of physical, cognitive, psychosocial history related to current functional performance.  CLINICAL DECISION MAKING: LOW - limited treatment options, no task modification necessary  REHAB POTENTIAL: Good  EVALUATION COMPLEXITY: Low      PLAN:  OT FREQUENCY: 1x/week  OT DURATION: 12 weeks, anticipate d/c after 4-6 weeks  PLANNED INTERVENTIONS: 97168 OT Re-evaluation, 97535 self care/ADL  training, 02889 therapeutic exercise, 97530 therapeutic activity, 97112 neuromuscular re-education, 97140 manual therapy, 97035 ultrasound, 97018 paraffin, 02989 moist heat, 97010 cryotherapy, 97034 contrast bath, 97014 electrical stimulation unattended, 97760 Orthotic Initial, 97763 Orthotic/Prosthetic subsequent, passive range of motion, energy conservation, coping strategies training, patient/family education, and DME and/or AE instructions  RECOMMENDED OTHER SERVICES: n/a  CONSULTED AND AGREED WITH PLAN OF CARE: Patient  PLAN FOR NEXT SESSION:  splint check, progressive strengthening   Ulus Hazen, OT 08/03/2024, 3:24 PM

## 2024-08-03 NOTE — Patient Instructions (Signed)
 Your Splint This splint should initially be fitted by a healthcare practitioner.  The healthcare practitioner is responsible for providing wearing instructions and precautions to the patient, other healthcare practitioners and care provider involved in the patient's care.  This splint was custom made for you. Please read the following instructions to learn about wearing and caring for your splint.  Precautions Should your splint cause any of the following problems, remove the splint immediately and contact your therapist/physician. Swelling Severe Pain Pressure Areas Stiffness Numbness  Do not wear your splint while operating machinery unless it has been fabricated for that purpose.  When To Wear Your Splint Where your splint according to your therapist/physician instructions. Daytime as needed with activity Care and Cleaning of Your Splint Keep your splint away from open flames. Your splint will lose its shape in temperatures over 135 degrees Farenheit, ( in car windows, near radiators, ovens or in hot water).  Never make any adjustments to your splint, if the splint needs adjusting remove it and make an appointment to see your therapist. Your splint, including the cushion liner may be cleaned with soap and lukewarm water.  Do not immerse in hot water over 135 degrees Farenheit.

## 2024-08-05 ENCOUNTER — Ambulatory Visit: Admitting: Physician Assistant

## 2024-08-05 ENCOUNTER — Encounter: Payer: Self-pay | Admitting: Physician Assistant

## 2024-08-05 VITALS — BP 107/78 | HR 70 | Ht 68.0 in | Wt 145.0 lb

## 2024-08-05 DIAGNOSIS — F3340 Major depressive disorder, recurrent, in remission, unspecified: Secondary | ICD-10-CM | POA: Diagnosis not present

## 2024-08-05 DIAGNOSIS — F410 Panic disorder [episodic paroxysmal anxiety] without agoraphobia: Secondary | ICD-10-CM

## 2024-08-05 DIAGNOSIS — H6122 Impacted cerumen, left ear: Secondary | ICD-10-CM | POA: Diagnosis not present

## 2024-08-05 DIAGNOSIS — F411 Generalized anxiety disorder: Secondary | ICD-10-CM

## 2024-08-05 DIAGNOSIS — N898 Other specified noninflammatory disorders of vagina: Secondary | ICD-10-CM | POA: Diagnosis not present

## 2024-08-05 DIAGNOSIS — F5101 Primary insomnia: Secondary | ICD-10-CM | POA: Diagnosis not present

## 2024-08-05 LAB — POCT URINALYSIS DIP (CLINITEK)
Bilirubin, UA: NEGATIVE
Blood, UA: NEGATIVE
Glucose, UA: NEGATIVE mg/dL
Ketones, POC UA: NEGATIVE mg/dL
Leukocytes, UA: NEGATIVE
Nitrite, UA: NEGATIVE
POC PROTEIN,UA: NEGATIVE
Spec Grav, UA: 1.01 (ref 1.010–1.025)
Urobilinogen, UA: 0.2 U/dL
pH, UA: 7 (ref 5.0–8.0)

## 2024-08-05 MED ORDER — LORAZEPAM 0.5 MG PO TABS
0.5000 mg | ORAL_TABLET | Freq: Three times a day (TID) | ORAL | 5 refills | Status: DC | PRN
Start: 2024-08-05 — End: 2024-08-05

## 2024-08-05 MED ORDER — ESZOPICLONE 3 MG PO TABS: 3.0000 mg | ORAL_TABLET | Freq: Every evening | ORAL | 1 refills | Status: DC | PRN

## 2024-08-05 MED ORDER — ESZOPICLONE 3 MG PO TABS: 3.0000 mg | ORAL_TABLET | Freq: Every evening | ORAL | 1 refills | Status: AC | PRN

## 2024-08-05 MED ORDER — CITALOPRAM HYDROBROMIDE 40 MG PO TABS
40.0000 mg | ORAL_TABLET | Freq: Every day | ORAL | 1 refills | Status: DC
Start: 2024-08-05 — End: 2024-08-05

## 2024-08-05 MED ORDER — CITALOPRAM HYDROBROMIDE 40 MG PO TABS: 40.0000 mg | ORAL_TABLET | Freq: Every day | ORAL | 1 refills | Status: AC

## 2024-08-05 MED ORDER — LORAZEPAM 0.5 MG PO TABS: 0.5000 mg | ORAL_TABLET | Freq: Three times a day (TID) | ORAL | 5 refills | Status: AC | PRN

## 2024-08-05 NOTE — Progress Notes (Addendum)
 "  Established Patient Office Visit  Subjective   Patient ID: Betty Stuart, female    DOB: 23-Jun-1961  Age: 63 y.o. MRN: [id number]  Chief Complaint  Patient presents with   Medical Management of Chronic Issues    HPI Discussed the use of AI scribe software for clinical note transcription with the patient, who gave verbal consent to proceed.  History of Present Illness Betty Stuart is a 63 year old female who presents with chronic ear issues, vaginal discharge and medication refills.   Left ear clogged - hx of cerumen impaction and not been using the debrox drops - intermittent pressure in left ear and some hearing loss - no discharge  Vaginal odor - intermittent without discharge or itching - no dysuira - not used anything to make better  Mood and occupational stress - Works two jobs, which she finds challenging - Mood described as 'not too bad' - sleeping well, most of the time.  - compliant with celexa , lunesta  and xanax     ROS See HPI.    Objective:     BP 107/78   Pulse 70   Ht 5' 8 (1.727 m)   Wt 145 lb (65.8 kg)   LMP 06/07/2014   SpO2 99%   BMI 22.05 kg/m  BP Readings from Last 3 Encounters:  08/05/24 107/78  06/23/24 107/64  03/04/24 104/68   Wt Readings from Last 3 Encounters:  08/05/24 145 lb (65.8 kg)  06/23/24 146 lb (66.2 kg)  03/04/24 146 lb (66.2 kg)    .SABRA    08/05/2024    8:19 AM 02/04/2024    6:39 AM 08/02/2023    8:32 AM 01/30/2023    9:49 AM 07/30/2022    8:40 AM  Depression screen PHQ 2/9  Decreased Interest 1 0 0 0 2  Down, Depressed, Hopeless 1 0 0 1 2  PHQ - 2 Score 2 0 0 1 4  Altered sleeping 2 0 1 2 2   Tired, decreased energy 2 0 1 1 2   Change in appetite 1 0 0 0 2  Feeling bad or failure about yourself  0 0 0 0 1  Trouble concentrating 1 0 1 1 2   Moving slowly or fidgety/restless 0 0 0 0 2  Suicidal thoughts 0 0 0 0 1  PHQ-9 Score 8  0  3  5  16    Difficult doing work/chores Not difficult at all Not difficult at  all Not difficult at all Not difficult at all Somewhat difficult     Data saved with a previous flowsheet row definition   .SABRA    08/05/2024    8:20 AM 02/04/2024    6:39 AM 08/02/2023    8:32 AM 01/30/2023    9:49 AM  GAD 7 : Generalized Anxiety Score  Nervous, Anxious, on Edge 1 1 0 1  Control/stop worrying 0 1 0 1  Worry too much - different things 0 1 0 0  Trouble relaxing 0 0 1 0  Restless 0 0 0 0  Easily annoyed or irritable 0 0 1 1  Afraid - awful might happen 0 0 0 0  Total GAD 7 Score 1 3 2 3   Anxiety Difficulty Not difficult at all Not difficult at all Not difficult at all Not difficult at all    .SABRACerumen Removal Template: Indication: Cerumen impaction of the  left ear(s) Medical necessity statement: On physical examination, cerumen impairs clinically significant portions of the external auditory canal, and tympanic  membrane. Noted obstructive, copious cerumen that cannot be removed without magnification and instrumentations requiring physician skills Consent: Discussed benefits and risks of procedure and verbal consent obtained Procedure: Patient was prepped for the procedure. Utilized an otoscope to assess and take note of the ear canal, the tympanic membrane, and the presence, amount, and placement of the cerumen. Gentle water irrigation and soft plastic curette was utilized to remove cerumen.  Post procedure examination shows cerumen was completely removed. Patient tolerated procedure well. The patient is made aware that they may experience temporary vertigo, temporary hearing loss, and temporary discomfort. If these symptom last for more than 24 hours to call the clinic or proceed to the ED.   Physical Exam Constitutional:      Appearance: Normal appearance.  HENT:     Right Ear: Tympanic membrane, ear canal and external ear normal. There is no impacted cerumen.     Left Ear: There is impacted cerumen.     Nose: Nose normal.     Mouth/Throat:     Mouth: Mucous  membranes are moist.  Eyes:     Conjunctiva/sclera: Conjunctivae normal.  Cardiovascular:     Rate and Rhythm: Normal rate.  Pulmonary:     Effort: Pulmonary effort is normal.     Breath sounds: Normal breath sounds.  Musculoskeletal:     Cervical back: Normal range of motion and neck supple. No tenderness.     Right lower leg: No edema.     Left lower leg: No edema.  Lymphadenopathy:     Cervical: No cervical adenopathy.  Neurological:     General: No focal deficit present.     Mental Status: She is alert and oriented to person, place, and time.  Psychiatric:        Mood and Affect: Mood normal.          Assessment & Plan:  SABRASABRADawn was seen today for medical management of chronic issues.  Diagnoses and all orders for this visit:  GAD (generalized anxiety disorder) -     LORazepam  (ATIVAN ) 0.5 MG tablet; Take 1 tablet (0.5 mg total) by mouth every 8 (eight) hours as needed. for anxiety -     citalopram  (CELEXA ) 40 MG tablet; Take 1 tablet (40 mg total) by mouth daily.  Vaginal odor -     POCT URINALYSIS DIP (CLINITEK) -     Urine Culture -     NuSwab Vaginitis Plus (VG+)  Left ear impacted cerumen  Panic attack -     LORazepam  (ATIVAN ) 0.5 MG tablet; Take 1 tablet (0.5 mg total) by mouth every 8 (eight) hours as needed. for anxiety  Recurrent major depressive disorder, in remission -     citalopram  (CELEXA ) 40 MG tablet; Take 1 tablet (40 mg total) by mouth daily.  Primary insomnia -     Eszopiclone  3 MG TABS; Take 1 tablet (3 mg total) by mouth at bedtime as needed.   Assessment & Plan Chronic left ear problem (impacted cerumen, left ear) Chronic impacted cerumen in the left ear, no significant pain or discharge, and rare itching. - Cleaned out left ear in office today.  - Use debrox ear drops once a week.  Vaginal odor, no discharge - Nuswab sent off today and will treat accordingly  Generalized anxiety disorder/Insomnia/MDD PHQ/GAD stable Anxiety  managed with citalopram  and xanax. Mood stable despite work-related stress. Insomnia managed with lunesta . Refills sent today for 6 months.   General Health Maintenance Declined all vaccines during this  visit.     Neven Fina, PA-C  "

## 2024-08-07 ENCOUNTER — Encounter: Payer: Self-pay | Admitting: Physician Assistant

## 2024-08-07 ENCOUNTER — Ambulatory Visit: Payer: Self-pay | Admitting: Physician Assistant

## 2024-08-07 LAB — URINE CULTURE: Organism ID, Bacteria: NO GROWTH

## 2024-08-07 NOTE — Progress Notes (Signed)
 No bacteria in urine culture. Still waiting for vagina culture.

## 2024-08-07 NOTE — Progress Notes (Signed)
Negative for yeast and BV.

## 2024-08-08 LAB — NUSWAB VAGINITIS PLUS (VG+)
Candida albicans, NAA: NEGATIVE
Candida glabrata, NAA: NEGATIVE
Chlamydia trachomatis, NAA: NEGATIVE
Neisseria gonorrhoeae, NAA: NEGATIVE
Trich vag by NAA: NEGATIVE

## 2024-08-10 ENCOUNTER — Ambulatory Visit: Admitting: Occupational Therapy

## 2024-08-10 ENCOUNTER — Encounter: Payer: Self-pay | Admitting: Occupational Therapy

## 2024-08-10 DIAGNOSIS — R600 Localized edema: Secondary | ICD-10-CM

## 2024-08-10 DIAGNOSIS — M25642 Stiffness of left hand, not elsewhere classified: Secondary | ICD-10-CM

## 2024-08-10 DIAGNOSIS — M79642 Pain in left hand: Secondary | ICD-10-CM

## 2024-08-10 DIAGNOSIS — M6281 Muscle weakness (generalized): Secondary | ICD-10-CM

## 2024-08-10 NOTE — Patient Instructions (Signed)
 Wrist Extension: Resisted    With right palm down, __1__ pound weight / hammer in hand, bend wrist up. Return slowly. Repeat _10___ times per set. Do __1_ sets per session. Do ___1_ sessions per day.  Copyright  VHI. All rights reserved.  Wrist Flexion: Resisted    With right palm up, _1___ pound weight/ hammer in hand, bend wrist up. Return slowly. Repeat __10__ times per set. Do __1__ sets per session. Do _1___ sessions per day.  Copyright  VHI. All rights reserved.  Forearm Pronation / Supination: Resisted (Sitting)    With right forearm supported, grasp object/ hammer and gently rotate palm up, then down, as far as possible without pain. Repeat _10___ times per set. Do __1__ sets per session. Do __1__ sessions per day.  Copyright  VHI. All rights reserved.

## 2024-08-10 NOTE — Therapy (Signed)
 OUTPATIENT OCCUPATIONAL THERAPY ORTHO treatment  Patient Name: Betty Stuart MRN: [id number] DOB:26-Aug-1961, 63 y.o., female Today's Date: 08/10/2024  PCP: Antoniette Rasher PA-C REFERRING PROVIDER: Antoniette Rasher PA-C  END OF SESSION:  OT End of Session - 08/10/24 1537     Visit Number 6    Number of Visits 12    Date for Recertification  09/24/24    Authorization Type [employer]    Authorization Time Period 12 weeks    Authorization - Visit Number 6    Authorization - Number of Visits 20    OT Start Time 1535    OT Stop Time 1615    OT Time Calculation (min) 40 min             Past Medical History:  Diagnosis Date   Allergy    Anxiety    Depression    GERD (gastroesophageal reflux disease)    Past Surgical History:  Procedure Laterality Date   BUNIONECTOMY     CESAREAN SECTION     herniated disk     Patient Active Problem List   Diagnosis Date Noted   Vaginal odor 08/05/2024   Left ear impacted cerumen 08/05/2024   Left hand pain 06/26/2024   Bilateral impacted cerumen 06/23/2024   Hyperthyroidism 08/02/2023   Stressful life event affecting family 01/01/2022   Recurrent major depressive disorder, in remission 01/01/2022   Decreased thyroid  stimulating hormone (TSH) level 02/22/2021   Toenail fungus 03/13/2017   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   History of cervical dysplasia 11/29/2015   Lumbar herniated disc 06/01/2015   Panic attack 12/01/2014   GAD (generalized anxiety disorder) 12/01/2014   Depression 12/01/2014   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 see above for PMH  PRECAUTIONS: None  RED FLAGS: None   WEIGHT BEARING RESTRICTIONS: No  PAIN:  Are you having pain? Yes: NPRS scale: 0-1/10, Pain location: left thenar eminence Pain description: aching Aggravating factors: overuse Relieving factors: rest  FALLS: Has patient fallen in last 6 months? Yes. Number of falls 1, climbing on stool  LIVING ENVIRONMENT: Lives with: lives with their family and lives alone Lives in: House/apartment   PLOF:   PATIENT GOALS: improve pain and and ROM  NEXT MD VISIT: unknown  OBJECTIVE:  Note: Objective measures were completed at Evaluation unless otherwise noted.  HAND DOMINANCE: Left  ADLs: Overall ADLs: difficulty with donning lotion, and fastening belts  FUNCTIONAL OUTCOME MEASURES: Quick Dash: 30% impairment  UPPER EXTREMITY ROM:      Active ROM Right eval Left eval  Thumb MCP (0-60)  55  Thumb IP (0-80)  90  Thumb Radial abd/add (0-55)  10-40  Thumb Palmar abd/add (0-45)  10-60   Thumb Opposition to Small Finger  yes with difficulty   Index MCP (0-90)     Index PIP (0-100)     Index DIP (0-70)      Long  MCP (0-90)      Long PIP (0-100)      Long DIP (0-70)      Ring MCP (0-90)      Ring PIP (0-100)      Ring DIP (0-70)      Little MCP (0-90)      Little PIP (0-100)      Little DIP (0-70)      (Blank rows = not tested)    HAND FUNCTION: Grip strength: Right: 37 lbs; Left: 25 lbs, Lateral pinch: Right: 14 lbs, Left: 4 lbs, 3 point pinch: Right: 12 lbs, Left: 4 lbs, and Tip pinch: Right 10 lbs, Left: 6 lbs    SENSATION: WFL  EDEMA: mild at thenar eminence  COGNITION: Overall cognitive status: Within functional limits for tasks assessed   OBSERVATIONS: Pleasant female motivated to improve thumb pain and  ROM   TREATMENT DATE: 08/10/24- paraffin x 10 mins to LUE for pain and stiffness, no adverse reactions A/ROM, thumb flexion/ extension with support at [facility] joint,  radial abduction with passive stretch, opposition 10 reps, min v.c Pt was instructed in wrist strengthening HEP, with  1 lbs hammer 10-20 reps each see pt instructions Velcro roller for finger rolls and key pinch with both large and small sizes, min difficulty/ v.c Red flexbar for grip and wrist strengthening 20 reps, min v.c to avoid compensation Graded clothespins 1-8# for sustained pinch, min difficulty Gripper set at level 2 to pick up blocks with sustained grip, min difficulty   11/03/25US 3.3 mhz,0.8 w/cm2, 20% x8 mins no adverse reactions A/ROM, thumb flexion/ extension, opposition 10 reps Thumb flexion with passive ulnar deviation stretch, x 5 reps  Passive stretch in thumb radial abduction x 5 reps Pain with passive wrist radial deviation, so stretch was discontinues. Red putty was issued and pt performed composite grip and tip pinch, pain with rolling out so pt transitioned to yeelow putty. Pt to use yellow putty this week. Pt was fitted with a thumb and wrist brace to wear during yard work or heavier activities as pt mowed and used leaf blower without her brace this weekend. Pt was educated in wear and precautions.   07/27/24 US  3.3 mhz,0.8 w/cm2, 20% x8 mins no adverse reactions A/ROM, thumb flexion/ extension, opposition 10 reps Thumb flexion with passive ulnar deviation stretch, x 5 reps  Passive stretch in thumb radial abduction x 5 reps Velcro board for key grip and large key grip 4 reps each Grip strength LUE 43 lbs Red flexbar for grip with rotation each direction x10 reps Gripper set at level 1 to pick up blocks, pt was able to pick up 7 blocks prior to fatigue. Graded clothespins 1-8# for sustained pinch with LUE, min difficulty Paraffin at end of session for pain and stiffness  x8 mins, no adverse  reactions.  07/21/24 Paraffin to LUE x 10 mins for stiffness and pain, no adverse reactions. A/ROM, thumb flexion/ extension, opposition 10 reps Thumb flexion with passive ulnar deviation stretch, x 5 reps  Passive stretch in thumb radial abduction x 5 reps Yellow putty exercises for sustained grip, tip pinch and lateral pinch, min v.c  Prayer stretch x 10 reps Discussed avoiding overuse of thumb and pt to stop putty exercises if pain increases.   07/15/24-Pt arrived wearing her thumb brace. Modalities: Ultrasound: Time: 8 mins Location: L thumb and thernar eminence 3.3 w/cm2, 20% for pain and edema, no adverse reactions Therapist perfromed massage to L thumb and thenar eminence  Reveiwed previously issued HEP 10 reps each, min v.c Thumb flexion with passive ulnar deviation stretch, x 5 reps  Pt was cautioned against heavy use of her L hand and to wear brace with heavier activities to minimize pain. Pt was cautioned against repetative gripping and pinching. Ice massage to L thumb and thenar eminence x 5 mins, no adverse reactions No pain at end of session.  07/02/24- eval, Pt brought in her thumb pre-fab brace. It fits well. Pt was encouraged to wear brace with heavier tasks. See pt education                                                                                                                            Modalities: Ultrasound:  Time: 8 mins Location: L thumb and thernar eminence 3.3 w/cm2, 20% for pain and edema, no adverse reactions       PATIENT EDUCATION: Education details: custom splint wear and precautions  Person educated: Patient Education method: verbal cues , demonstration  Education comprehension: verbalized understanding, returned demonstration, and verbal cues required  HOME EXERCISE PROGRAM: 07/02/24- A/ROM  GOALS: Goals reviewed with patient? Yes  SHORT TERM GOALS: Target date: 08/01/24  I with inital HEP  Goal status: met, 07/15/24  2.  I  with splint wear schedule, edema control and massage  Goal status:met, 07/21/24  3.  Pt will increase L thumb radial abduction to 60*for increased functional use Baseline: 40 Goal status: met 60*  4.  Pt will report LUE pain no greater than 3/10 for light functional use. Baseline:4/10 Goal status: met 3/10 pain at most 07/21/24  5.  I with adapted strategies/ AE to improve LUE pain and maximize independence :  Goal status: ongoing 08/10/24    LONG TERM GOALS: Target date: 09/24/24  I with updated HEP  Goal status: ongoing 08/10/24  2.  Pt will increase L grip strength to 35 lbs or better for increased functional use. Baseline: R 37, L 25 Goal status: met, 43 lbs 07/27/24  3.  Pt will increase L lateral and 3pt pinch to at least 10 lbs for increased functional use. Baseline: L lateral and 3pt pinch 4 lbs Goal status: met 12 lbs for both 08/03/24  4.  Pt will demonstrate improved LUE functional sue as evidenced by improving Quick DASH score to 25% or better Baseline: 30% impairment Goal status: ongoing 08/03/24    ASSESSMENT:  CLINICAL IMPRESSION: Patient is progressing towards goals. Pt demonstrates improving strength. Pt was noted to have wrist weakness today and provided with exercises to address this. PERFORMANCE DEFICITS: in functional skills including ADLs, IADLs, coordination, dexterity, edema, ROM, strength, pain, flexibility, decreased knowledge of precautions, decreased knowledge of use of DME, and UE functional use,  and psychosocial skills including coping strategies, environmental adaptation, habits, interpersonal interactions, and routines and behaviors.   IMPAIRMENTS: are limiting patient from ADLs, IADLs, rest and sleep, work, play, leisure, and social participation.   COMORBIDITIES: may have co-morbidities  that affects occupational performance. Patient will benefit from skilled OT to address above impairments and improve overall function.  MODIFICATION  OR ASSISTANCE TO COMPLETE EVALUATION: No modification of tasks or assist necessary to complete an evaluation.  OT OCCUPATIONAL PROFILE AND HISTORY: Detailed assessment: Review of records and additional review of physical, cognitive, psychosocial history related to current functional performance.  CLINICAL DECISION MAKING: LOW - limited treatment options, no task modification necessary  REHAB POTENTIAL: Good  EVALUATION COMPLEXITY: Low      PLAN:  OT FREQUENCY: 1x/week  OT DURATION: 12 weeks, anticipate d/c after 4-6 weeks  PLANNED INTERVENTIONS: 97168 OT Re-evaluation, 97535 self care/ADL training, 02889 therapeutic exercise, 97530 therapeutic activity, 97112 neuromuscular re-education, 97140 manual therapy, 97035 ultrasound, 97018 paraffin, 02989 moist heat, 97010 cryotherapy, 97034 contrast bath, 97014 electrical stimulation unattended, 97760 Orthotic Initial, 97763 Orthotic/Prosthetic subsequent, passive range of motion, energy conservation, coping strategies training, patient/family education, and DME and/or AE instructions  RECOMMENDED OTHER SERVICES: n/a  CONSULTED AND AGREED WITH PLAN OF CARE: Patient  PLAN FOR NEXT SESSION:   wrist strengthening, d/c vs add visits   Sreeja Spies, OT 08/10/2024, 3:41 PM

## 2024-08-10 NOTE — Progress Notes (Signed)
Negative for yeast, BV

## 2024-08-18 ENCOUNTER — Ambulatory Visit: Payer: Self-pay | Admitting: Occupational Therapy

## 2024-08-18 DIAGNOSIS — M6281 Muscle weakness (generalized): Secondary | ICD-10-CM

## 2024-08-18 DIAGNOSIS — M25642 Stiffness of left hand, not elsewhere classified: Secondary | ICD-10-CM

## 2024-08-18 DIAGNOSIS — M79642 Pain in left hand: Secondary | ICD-10-CM | POA: Diagnosis not present

## 2024-08-18 DIAGNOSIS — R600 Localized edema: Secondary | ICD-10-CM

## 2024-08-18 NOTE — Therapy (Signed)
 OUTPATIENT OCCUPATIONAL THERAPY ORTHO treatment  Patient Name: Betty Stuart MRN: [id number] DOB:05-Jul-1961, 63 y.o., female Today's Date: 08/21/2024  PCP: Antoniette Rasher PA-C REFERRING PROVIDER: Antoniette Rasher PA-C  OCCUPATIONAL THERAPY DISCHARGE SUMMARY    Current functional level related to goals / functional outcomes: Pt met all goals and demonstrates excellent progress.   Remaining deficits: mildly decreased strength   Education / Equipment: Pt was educated in splint wear, care and precautions and HEP. Pt demonstrates understanding.   Patient agrees to discharge. Patient goals were met. Patient is being discharged due to being pleased with the current functional level..    END OF SESSION:  OT End of Session - 08/21/24 1052     Visit Number 7    Number of Visits 12    Date for Recertification  09/24/24    Authorization Type [employer]    Authorization Time Period 12 weeks    Authorization - Visit Number 7    Authorization - Number of Visits 20    OT Start Time 1535    OT Stop Time 1609    OT Time Calculation (min) 34 min    Activity Tolerance Patient tolerated treatment well    Behavior During Therapy WFL for tasks assessed/performed              Past Medical History:  Diagnosis Date   Allergy    Anxiety    Depression    GERD (gastroesophageal reflux disease)    Past Surgical History:  Procedure Laterality Date   BUNIONECTOMY     CESAREAN SECTION     herniated disk     Patient Active Problem List   Diagnosis Date Noted   Vaginal odor 08/05/2024   Left ear impacted cerumen 08/05/2024   Left hand pain 06/26/2024   Bilateral impacted cerumen 06/23/2024   Hyperthyroidism 08/02/2023   Stressful life event affecting family 01/01/2022   Recurrent major depressive disorder, in remission 01/01/2022   Decreased thyroid  stimulating hormone (TSH) level 02/22/2021   Toenail fungus 03/13/2017   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   History of cervical dysplasia  11/29/2015   Lumbar herniated disc 06/01/2015   Panic attack 12/01/2014   GAD (generalized anxiety disorder) 12/01/2014   Depression 12/01/2014   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 see above for PMH  PRECAUTIONS: None  RED FLAGS: None   WEIGHT BEARING RESTRICTIONS: No  PAIN:  Are you having pain? Yes: NPRS scale: 0-1/10, Pain location: left thenar eminence Pain description: aching Aggravating factors: overuse Relieving factors: rest  FALLS: Has patient fallen in last 6 months? Yes. Number of falls 1, climbing on stool  LIVING ENVIRONMENT: Lives with: lives with their family and lives alone Lives in: House/apartment   PLOF:   PATIENT GOALS: improve pain and and ROM  NEXT MD VISIT: unknown  OBJECTIVE:  Note: Objective measures were completed at Evaluation unless otherwise noted.  HAND DOMINANCE: Left  ADLs: Overall ADLs: difficulty with donning lotion, and fastening belts  FUNCTIONAL OUTCOME MEASURES: Quick Dash: 30% impairment  UPPER EXTREMITY ROM:      Active ROM Right eval Left eval  Thumb MCP (0-60)  55  Thumb IP (0-80)  90  Thumb Radial abd/add (0-55)  10-40  Thumb Palmar abd/add (0-45)  10-60   Thumb Opposition to Small Finger  yes with difficulty   Index MCP (0-90)     Index PIP (0-100)     Index DIP (0-70)      Long MCP (0-90)      Long PIP (0-100)      Long  DIP (0-70)      Ring MCP (0-90)      Ring PIP (0-100)      Ring DIP (0-70)      Little MCP (0-90)      Little PIP (0-100)      Little DIP (0-70)      (Blank rows = not tested)    HAND FUNCTION: Grip strength: Right: 37 lbs; Left: 25 lbs, Lateral pinch: Right: 14 lbs, Left: 4 lbs, 3 point pinch: Right: 12 lbs, Left: 4 lbs, and Tip pinch: Right 10 lbs, Left: 6 lbs    SENSATION: WFL  EDEMA: mild at thenar eminence  COGNITION: Overall cognitive status: Within functional limits for tasks assessed   OBSERVATIONS: Pleasant female motivated to improve thumb pain and ROM   TREATMENT DATE: 08/18/24 paraffin x 10 mins to LUE for pain and stiffness,while therapist performed minor adjustments to splint. no adverse reactions to paraffin. Pt reports that splint fits well following adjustements and she will wear for heavier tasks.  Thumb abduction A/ROM, and opposition Pt performed wrist strengthening HEP, with  1 lbs weight 10-20 reps each  Reveiwed red putty HEP, for sustained pinch and grip. Therpist checked progress towards goals, pt agrees with plans for d/c.  08/10/24- paraffin x 10 mins to LUE for pain and stiffness, no adverse reactions A/ROM, thumb flexion/ extension with support at [facility] joint,  radial abduction with passive stretch, opposition 10 reps, min v.c Pt was instructed in wrist strengthening HEP, with  1 lbs hammer 10-20 reps each see pt instructions Velcro roller for finger rolls and key pinch with both large and small sizes, min difficulty/ v.c Red flexbar for grip and wrist strengthening 20 reps, min v.c to avoid compensation Graded clothespins 1-8# for sustained pinch, min difficulty Gripper set at level 2 to pick up blocks with sustained grip, min difficulty   11/03/25US 3.3 mhz,0.8 w/cm2, 20% x8 mins no adverse reactions A/ROM, thumb flexion/ extension, opposition 10 reps Thumb flexion with passive ulnar deviation stretch, x 5 reps  Passive stretch in thumb  radial abduction x 5 reps Pain with passive wrist radial deviation, so stretch was discontinues. Red putty was issued and pt performed composite grip and tip pinch, pain with rolling out so pt transitioned to yellow putty. Pt to use yellow putty this week. Pt was fitted with a thumb and wrist brace to wear during yard work or heavier activities as pt mowed and used leaf blower without her brace this weekend. Pt was educated in wear and precautions.   07/27/24 US  3.3 mhz,0.8 w/cm2, 20% x8 mins no adverse reactions A/ROM, thumb flexion/ extension, opposition 10 reps Thumb flexion with passive ulnar deviation stretch, x 5 reps  Passive stretch in thumb radial abduction x 5 reps Velcro board for key grip and large key grip 4 reps each Grip  strength LUE 43 lbs Red flexbar for grip with rotation each direction x10 reps Gripper set at level 1 to pick up blocks, pt was able to pick up 7 blocks prior to fatigue. Graded clothespins 1-8# for sustained pinch with LUE, min difficulty Paraffin at end of session for pain and stiffness  x8 mins, no adverse reactions.  07/21/24 Paraffin to LUE x 10 mins for stiffness and pain, no adverse reactions. A/ROM, thumb flexion/ extension, opposition 10 reps Thumb flexion with passive ulnar deviation stretch, x 5 reps  Passive stretch in thumb radial abduction x 5 reps Yellow putty exercises for sustained grip, tip pinch and lateral pinch, min v.c  Prayer stretch x 10 reps Discussed avoiding overuse of thumb and pt to stop putty exercises if pain increases.   07/15/24-Pt arrived wearing her thumb brace. Modalities: Ultrasound: Time: 8 mins Location: L thumb and thernar eminence 3.3 w/cm2, 20% for pain and edema, no adverse reactions Therapist perfromed massage to L thumb and thenar eminence  Reveiwed previously issued HEP 10 reps each, min v.c Thumb flexion with passive ulnar deviation stretch, x 5 reps  Pt was cautioned against heavy use of her L hand and  to wear brace with heavier activities to minimize pain. Pt was cautioned against repetative gripping and pinching. Ice massage to L thumb and thenar eminence x 5 mins, no adverse reactions No pain at end of session.  07/02/24- eval, Pt brought in her thumb pre-fab brace. It fits well. Pt was encouraged to wear brace with heavier tasks. See pt education                                                                                                                            Modalities: Ultrasound:  Time: 8 mins Location: L thumb and thernar eminence 3.3 w/cm2, 20% for pain and edema, no adverse reactions       PATIENT EDUCATION: Education details: custom splint wear and precautions, progress towards goals  Person educated: Patient Education method: verbal cues , demonstration  Education comprehension: verbalized understanding, returned demonstration, and verbal cues required  HOME EXERCISE PROGRAM: 07/02/24- A/ROM  GOALS: Goals reviewed with patient? Yes  SHORT TERM GOALS: Target date: 08/01/24  I with inital HEP  Goal status: met, 07/15/24  2.  I with splint wear schedule, edema control and massage  Goal status:met, 07/21/24  3.  Pt will increase L thumb radial abduction to 60*for increased functional use Baseline: 40 Goal status: met 60*  4.  Pt will report LUE pain no greater than 3/10 for light functional use. Baseline:4/10 Goal status: met 3/10 pain at most 07/21/24  5.  I with adapted strategies/ AE to improve LUE pain and maximize independence :  Goal status: met     LONG TERM GOALS: Target date: 09/24/24  I with updated HEP  Goal status: met putty, wrist strengthening 08/18/24  2.  Pt will increase L grip strength to 35 lbs or better  for increased functional use. Baseline: R 37, L 25 Goal status: met, 43 lbs 07/27/24  3.  Pt will increase L lateral and 3pt pinch to at least 10 lbs for increased functional use. Baseline: L lateral and 3pt pinch 4  lbs Goal status: met 12 lbs for both 08/03/24  4.  Pt will demonstrate improved LUE functional sue as evidenced by improving Quick DASH score to 25% or better Baseline: 30% impairment Goal status:  met 4.55 disability 08/18/24    ASSESSMENT:  CLINICAL IMPRESSION: Patient made excellent progress, and achieved all goals. PERFORMANCE DEFICITS: in functional skills including ADLs, IADLs, coordination, dexterity, edema, ROM, strength, pain, flexibility, decreased knowledge of precautions, decreased knowledge of use of DME, and UE functional use,  and psychosocial skills including coping strategies, environmental adaptation, habits, interpersonal interactions, and routines and behaviors.   IMPAIRMENTS: are limiting patient from ADLs, IADLs, rest and sleep, work, play, leisure, and social participation.   COMORBIDITIES: may have co-morbidities  that affects occupational performance. Patient will benefit from skilled OT to address above impairments and improve overall function.  MODIFICATION OR ASSISTANCE TO COMPLETE EVALUATION: No modification of tasks or assist necessary to complete an evaluation.  OT OCCUPATIONAL PROFILE AND HISTORY: Detailed assessment: Review of records and additional review of physical, cognitive, psychosocial history related to current functional performance.  CLINICAL DECISION MAKING: LOW - limited treatment options, no task modification necessary  REHAB POTENTIAL: Good  EVALUATION COMPLEXITY: Low      PLAN:  OT FREQUENCY: 1x/week  OT DURATION: 12 weeks, anticipate d/c after 4-6 weeks  PLANNED INTERVENTIONS: 97168 OT Re-evaluation, 97535 self care/ADL training, 02889 therapeutic exercise, 97530 therapeutic activity, 97112 neuromuscular re-education, 97140 manual therapy, 97035 ultrasound, 97018 paraffin, 02989 moist heat, 97010 cryotherapy, 97034 contrast bath, 97014 electrical stimulation unattended, 97760 Orthotic Initial, 97763 Orthotic/Prosthetic  subsequent, passive range of motion, energy conservation, coping strategies training, patient/family education, and DME and/or AE instructions  RECOMMENDED OTHER SERVICES: n/a  CONSULTED AND AGREED WITH PLAN OF CARE: Patient  PLAN FOR NEXT SESSION:    d/c OT   Giselle Brutus, OT 08/21/2024, 10:52 AM

## 2024-09-03 ENCOUNTER — Ambulatory Visit: Admitting: Internal Medicine

## 2024-09-03 ENCOUNTER — Other Ambulatory Visit

## 2024-09-03 ENCOUNTER — Encounter: Payer: Self-pay | Admitting: Internal Medicine

## 2024-09-03 VITALS — BP 102/60 | Ht 68.0 in | Wt 145.0 lb

## 2024-09-03 DIAGNOSIS — E0590 Thyrotoxicosis, unspecified without thyrotoxic crisis or storm: Secondary | ICD-10-CM | POA: Diagnosis not present

## 2024-09-03 NOTE — Progress Notes (Signed)
 Name: Betty Stuart  MRN/ DOB: [id number], Jul 31, 1961    Age/ Sex: 63 y.o., female    PCP: Antoniette Vermell CROME, PA-C   Reason for Endocrinology Evaluation: Subclinical Hyperthyroidism     Date of Initial Endocrinology Evaluation: 07/04/2021    HPI: Betty Stuart is a 63 y.o. female with a past medical history of GAD. The patient presented for initial endocrinology clinic visit on 07/04/2021 for consultative assistance with her Subclinical Hyperthyroidism.   She has been noted with suppressed TSH since 01/2021 with a nadir of 0.01 uIU/mL in 05/2021 with normal free T4 and T3.    On her initial visit to our clinic she was on methimazole  5 mg , started  in 05/2021  Methimazole  has improved hand tremors    NO FH of thyroid  disease   TRAB undetectable  Thyroid  ultrasound 07/2021 showed sub-centimeter nodules    SUBJECTIVE:    Today (09/03/24):  Betty Stuart is here for follow-up on hyperthyroidism.  Weight remains stable No local neck swelling  No palpitations  Had transient tremors a few weekends in a row since her last visit here, she also attributes part of the tremors to overexertion, as she does work 2 jobs She has chronic alternating bowel movements between constipation and diarrhea   Methimazole  5 mg, 1 tab  weekly    HISTORY:  Past Medical History:  Past Medical History:  Diagnosis Date   Allergy    Anxiety    Depression    GERD (gastroesophageal reflux disease)    Past Surgical History:  Past Surgical History:  Procedure Laterality Date   BUNIONECTOMY     CESAREAN SECTION     herniated disk      Social History:  reports that she has never smoked. She has never used smokeless tobacco. She reports current alcohol use of about 1.0 standard drink of alcohol per week. She reports that she does not use drugs. Family History: family history includes Alcohol abuse in her maternal uncle and paternal uncle; Kidney disease in her son.   HOME  MEDICATIONS: Allergies as of 09/03/2024       Reactions   Hydrocodone  Nausea And Vomiting   Remeron  [mirtazapine ]    Not effective.    Tramadol Nausea And Vomiting   Zoloft [sertraline Hcl] Rash        Medication List        Accurate as of September 03, 2024 10:46 AM. If you have any questions, ask your nurse or doctor.          citalopram  40 MG tablet Commonly known as: CELEXA  Take 1 tablet (40 mg total) by mouth daily.   Debrox 6.5 % OTIC solution Generic drug: carbamide peroxide Place 10 drops into both ears 2 (two) times daily. Dispense 1 bottle   Eszopiclone  3 MG Tabs Take 1 tablet (3 mg total) by mouth at bedtime as needed.   ibuprofen 100 MG tablet Commonly known as: ADVIL Take 100 mg by mouth as needed for fever.   LORazepam  0.5 MG tablet Commonly known as: ATIVAN  Take 1 tablet (0.5 mg total) by mouth every 8 (eight) hours as needed. for anxiety   methimazole  5 MG tablet Commonly known as: TAPAZOLE  1 tablet a week   TYLENOL 8 HOUR PO Take by mouth as needed.   valACYclovir 500 MG tablet Commonly known as: VALTREX Take 500 mg by mouth 2 (two) times daily.          REVIEW OF SYSTEMS: A  comprehensive ROS was conducted with the patient and is negative except as per HPI     OBJECTIVE:  VS: BP 102/60   Ht 5' 8 (1.727 m)   Wt 145 lb (65.8 kg)   LMP 06/07/2014   BMI 22.05 kg/m    Wt Readings from Last 3 Encounters:  09/03/24 145 lb (65.8 kg)  08/05/24 145 lb (65.8 kg)  06/23/24 146 lb (66.2 kg)     EXAM: General: Pt appears well and is in NAD  Neck: General: Supple without adenopathy. Thyroid :  No goiter or nodules appreciated.  Lungs: Clear with good BS bilat   Heart: Auscultation: RRR.  Abdomen: soft, nontender  Extremities:  BL LE: No pretibial edema normal   Mental Status: Judgment, insight: Intact Orientation: Oriented to time, place, and person Mood and affect: No depression, anxiety, or agitation     DATA REVIEWED:    Latest Reference Range & Units 09/03/24 10:52  TSH 0.40 - 4.50 mIU/L 1.22  T4,Free(Direct) 0.8 - 1.8 ng/dL 1.2     Latest Reference Range & Units 07/04/21 10:42  TRAB <=2.00 IU/L <1.00    Thyroid  ultrasound 07/13/2021  Estimated total number of nodules >/= 1 cm: 0   Number of spongiform nodules >/=  2 cm not described below (TR1): 0   Number of mixed cystic and solid nodules >/= 1.5 cm not described below (TR2): 0   _________________________________________________________   Minor thyroid  heterogeneity. There are a few scattered subcentimeter benign cystic nodules noted, all measuring 5 mm or less in size. These would not meet criteria for any biopsy or follow-up. No other significant thyroid  abnormality. No hypervascularity. No regional adenopathy.   IMPRESSION: Nonspecific minor thyroid  heterogeneity and subcentimeter benign cystic nodules.     ASSESSMENT/PLAN/RECOMMENDATIONS:    Hyperthyroidism:   - Patient continues to be clinically euthyroid -No local neck symptoms -TRAb was undetectable - Thyroid  ultrasound showed subcentimeter nodules. - Clinical scenario more consistent with Graves' disease - TFTs remain normal, will discontinue methimazole     Medications : Stop methimazole  5 mg , 1 tablet a week   F/U in 6 months  Signed electronically by: Stefano Redgie Butts, MD  [facility]  [facility] [address] [place] [place], [place] [address] Phone: [phone] FAX: [phone]   CC: Antoniette Vermell LITTIE DEVONNA ZANA [facility] [address] [place] [address] Phone: [phone] Fax: [phone]   Return to Endocrinology clinic as below: Future Appointments  Date Time Provider Department Center  02/02/2025  8:10 AM Antoniette Vermell LITTIE, PA-C [facility]

## 2024-09-04 ENCOUNTER — Ambulatory Visit: Payer: Self-pay | Admitting: Internal Medicine

## 2024-09-04 LAB — TSH: TSH: 1.22 m[IU]/L (ref 0.40–4.50)

## 2024-09-04 LAB — T4, FREE: Free T4: 1.2 ng/dL (ref 0.8–1.8)

## 2025-02-02 ENCOUNTER — Ambulatory Visit: Admitting: Physician Assistant

## 2025-03-04 ENCOUNTER — Ambulatory Visit: Admitting: Internal Medicine
